# Patient Record
Sex: Female | Born: 1990 | Race: Black or African American | Hispanic: No | Marital: Single | Smoking: Never smoker
Health system: Southern US, Community
[De-identification: ages and names within clinical notes are randomized; demographics above are authoritative.]

## PROBLEM LIST (undated history)

## (undated) DIAGNOSIS — R946 Abnormal results of thyroid function studies: Secondary | ICD-10-CM

## (undated) DIAGNOSIS — T7840XA Allergy, unspecified, initial encounter: Secondary | ICD-10-CM

## (undated) DIAGNOSIS — D649 Anemia, unspecified: Secondary | ICD-10-CM

## (undated) HISTORY — PX: COSMETIC SURGERY: SHX468

## (undated) HISTORY — DX: Allergy, unspecified, initial encounter: T78.40XA

## (undated) HISTORY — PX: WISDOM TOOTH EXTRACTION: SHX21

## (undated) HISTORY — DX: Abnormal results of thyroid function studies: R94.6

## (undated) HISTORY — PX: BREAST SURGERY: SHX581

## (undated) HISTORY — DX: Anemia, unspecified: D64.9

---
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
## 2014-12-02 HISTORY — PX: BREAST REDUCTION SURGERY: SHX8

## 2019-05-07 ENCOUNTER — Telehealth: Payer: Self-pay

## 2019-05-07 NOTE — Telephone Encounter (Signed)
Coronavirus (COVID-19) Are you at risk?  Are you at risk for the Coronavirus (COVID-19)?  To be considered HIGH RISK for Coronavirus (COVID-19), you have to meet the following criteria:  . Traveled to [place], [place], [place], [place] or [place]; or in the [place] to [place], [place], [place], or [place]; and have fever, cough, and shortness of breath within the last 2 weeks of travel OR . Been in close contact with a person diagnosed with COVID-19 within the last 2 weeks and have fever, cough, and shortness of breath . IF YOU DO NOT MEET THESE CRITERIA, YOU ARE CONSIDERED LOW RISK FOR COVID-19.  What to do if you are HIGH RISK for COVID-19?  Marland Kitchen If you are having a medical emergency, call 911. . Seek medical care right away. Before you go to a doctor's office, urgent care or emergency department, call ahead and tell them about your recent travel, contact with someone diagnosed with COVID-19, and your symptoms. You should receive instructions from your physician's office regarding next steps of care.  . When you arrive at healthcare provider, tell the healthcare staff immediately you have returned from visiting [place], [place], [place], [place] or [place]; or traveled in the [place] to [place], [place], [place], or [place]; in the last two weeks or you have been in close contact with a person diagnosed with COVID-19 in the last 2 weeks.   . Tell the health care staff about your symptoms: fever, cough and shortness of breath. . After you have been seen by a medical provider, you will be either: o Tested for (COVID-19) and discharged home on quarantine except to seek medical care if symptoms worsen, and asked to  - Stay home and avoid contact with others until you get your results (4-5 days)  - Avoid travel on public transportation if possible (such as bus, train, or airplane) or o Sent to the Emergency Department by EMS for evaluation, COVID-19 testing, and possible  admission depending on your condition and test results.  What to do if you are LOW RISK for COVID-19?  Reduce your risk of any infection by using the same precautions used for avoiding the common cold or flu:  Marland Kitchen Wash your hands often with soap and warm water for at least 20 seconds.  If soap and water are not readily available, use an alcohol-based hand sanitizer with at least 60% alcohol.  . If coughing or sneezing, cover your mouth and nose by coughing or sneezing into the elbow areas of your shirt or coat, into a tissue or into your sleeve (not your hands). . Avoid shaking hands with others and consider head nods or verbal greetings only. . Avoid touching your eyes, nose, or mouth with unwashed hands.  . Avoid close contact with people who are Margaret Harmon. . Avoid places or events with large numbers of people in one location, like concerts or sporting events. . Carefully consider travel plans you have or are making. . If you are planning any travel outside or inside the [place], visit the CDC's Travelers' Health webpage for the latest health notices. . If you have some symptoms but not all symptoms, continue to monitor at home and seek medical attention if your symptoms worsen. . If you are having a medical emergency, call 911.  05/07/19 SCREENING NEG SLS ADDITIONAL HEALTHCARE OPTIONS FOR PATIENTS  [facility] / e-Visit: [email or web address]         [facility]: [phone]  [facility]: [phone]                   [facility]: [phone]  

## 2019-05-10 ENCOUNTER — Encounter: Payer: Self-pay | Admitting: Certified Nurse Midwife

## 2019-05-21 ENCOUNTER — Other Ambulatory Visit (HOSPITAL_COMMUNITY)
Admission: RE | Admit: 2019-05-21 | Discharge: 2019-05-21 | Disposition: A | Discharge status: Home / Self Care | Payer: 59 | Source: Ambulatory Visit | Attending: Certified Nurse Midwife | Admitting: Certified Nurse Midwife

## 2019-05-21 ENCOUNTER — Other Ambulatory Visit: Payer: Self-pay

## 2019-05-21 ENCOUNTER — Encounter: Payer: Self-pay | Admitting: Certified Nurse Midwife

## 2019-05-21 ENCOUNTER — Ambulatory Visit (INDEPENDENT_AMBULATORY_CARE_PROVIDER_SITE_OTHER): Payer: 59 | Admitting: Certified Nurse Midwife

## 2019-05-21 VITALS — BP 123/82 | HR 74 | Ht 65.0 in | Wt 282.8 lb

## 2019-05-21 DIAGNOSIS — R102 Pelvic and perineal pain: Secondary | ICD-10-CM

## 2019-05-21 DIAGNOSIS — Z124 Encounter for screening for malignant neoplasm of cervix: Secondary | ICD-10-CM | POA: Diagnosis not present

## 2019-05-21 DIAGNOSIS — N926 Irregular menstruation, unspecified: Secondary | ICD-10-CM | POA: Insufficient documentation

## 2019-05-21 DIAGNOSIS — M545 Low back pain, unspecified: Secondary | ICD-10-CM

## 2019-05-21 DIAGNOSIS — Z01419 Encounter for gynecological examination (general) (routine) without abnormal findings: Secondary | ICD-10-CM

## 2019-05-21 NOTE — Progress Notes (Signed)
ANNUAL PREVENTATIVE CARE GYN  ENCOUNTER NOTE  Subjective:       Margaret Harmon is a 28 y.o. G0P0000 female here for a routine annual gynecologic exam.  Current complaints: 1. Irregular menses 2. Pelvic/back pain  Reports irregular menses for the last years. No cycle in May. Notes intermittent pelvic and back pain.   Denies difficulty breathing or respiratory distress, chest pain, abdominal pain, excessive vaginal bleeding, dysuria, and leg pain or swelling.    Gynecologic History  Patient's last menstrual period was 05/15/2019 (exact date). Period Duration (Days): 5 Period Pattern: (!) Irregular Menstrual Flow: Heavy Menstrual Control: Maxi pad Dysmenorrhea: (!) Moderate Dysmenorrhea Symptoms: Other (Comment)(back pain)  Contraception: abstinence  Last Pap: 2016. Results were: normal  Obstetric History  OB History  Gravida Para Term Preterm AB Living  0 0 0 0 0 0  SAB TAB Ectopic Multiple Live Births  0 0 0 0 0    Current Outpatient Medications on File Prior to Visit  Medication Sig Dispense Refill  . cyclobenzaprine (FLEXERIL) 5 MG tablet Take 5 mg by mouth as needed.    Marland Kitchen ibuprofen (ADVIL) 800 MG tablet Take 800 mg by mouth as needed.     No current facility-administered medications on file prior to visit.     No Known Allergies  Social History   Socioeconomic History  . Marital status: Single    Spouse name: Not on file  . Number of children: Not on file  . Years of education: Not on file  . Highest education level: Not on file  Occupational History  . Not on file  Social Needs  . Financial resource strain: Not on file  . Food insecurity    Worry: Not on file    Inability: Not on file  . Transportation needs    Medical: Not on file    Non-medical: Not on file  Tobacco Use  . Smoking status: Never Smoker  . Smokeless tobacco: Never Used  Substance and Sexual Activity  . Alcohol use: Yes    Comment: occasional   . Drug use: Never  . Sexual  activity: Not Currently    Birth control/protection: None  Lifestyle  . Physical activity    Days per week: Not on file    Minutes per session: Not on file  . Stress: Not on file  Relationships  . Social Herbalist on phone: Not on file    Gets together: Not on file    Attends religious service: Not on file    Active member of club or organization: Not on file    Attends meetings of clubs or organizations: Not on file    Relationship status: Not on file  . Intimate partner violence    Fear of current or ex partner: Not on file    Emotionally abused: Not on file    Physically abused: Not on file    Forced sexual activity: Not on file  Other Topics Concern  . Not on file  Social History Narrative  . Not on file    Family History  Problem Relation Age of Onset  . Diabetes Father   . Breast cancer Neg Hx   . Ovarian cancer Neg Hx   . Colon cancer Neg Hx     The following portions of the patient's history were reviewed and updated as appropriate: allergies, current medications, past family history, past medical history, past social history, past surgical history and problem list.  Review  of Systems  ROS negative except as noted above. Information obtained from patient.    Objective:   BP 123/82   Pulse 74   Ht 5\' 5"  (1.651 m)   Wt 282 lb 12.8 oz (128.3 kg)   LMP 05/15/2019 (Exact Date)   BMI 47.06 kg/m    CONSTITUTIONAL: Well-developed, well-nourished female in no acute distress.   PSYCHIATRIC: Normal Gitto and affect. Normal behavior. Normal judgment and thought content.  [place]: Alert and oriented to person, place, and time. Normal muscle tone coordination. No cranial nerve deficit noted.  HENT:  Normocephalic, atraumatic, External right and left ear normal.  EYES: Conjunctivae and EOM are normal. Pupils are equal and round.   NECK: Normal range of motion, supple, no masses.  Normal thyroid.   SKIN: Skin is warm and dry. No rash noted. Not  diaphoretic. No erythema. No pallor. Professional tattoos present.   CARDIOVASCULAR: Normal heart rate noted, regular rhythm, no murmur.  RESPIRATORY: Clear to auscultation bilaterally. Effort and breath sounds normal, no problems with respiration noted.  BREASTS: Patient declined exam.   ABDOMEN: Soft, normal bowel sounds, no distention noted.  No tenderness, rebound or guarding.   PELVIC:  External Genitalia: Normal  BUS: Normal  Vagina: Normal  Cervix: Normal, Pap collected  Uterus: Normal  Adnexa: Normal  MUSCULOSKELETAL: Normal range of motion. No tenderness.  No cyanosis, clubbing, or edema.  2+ distal pulses.  LYMPHATIC: No Axillary, Supraclavicular, or Inguinal Adenopathy.  Assessment:   Annual gynecologic examination 28 y.o.   Contraception: abstinence   Obesity 3   Problem List Items Addressed This Visit      Other   Obesity, morbid, BMI 40.0-49.9 ([place])   Irregular menses    Other Visit Diagnoses    Well woman exam    -  Primary   Screening for cervical cancer          Plan:   Pap: Pap, Reflex if ASCUS, will contact patient with results  Labs: See orders, will contact patient with results  Routine preventative health maintenance measures emphasized: Exercise/Diet/Weight control, Tobacco Warnings, Alcohol/Substance use risks, Stress Management, Peer Pressure Issues and Safe Sex; see AVS  Reviewed red flag symptoms and when to call  RTC x 1-2 weeks for ultrasound and results review or sooner if needed   Diona Fanti, CNM [facility], [facility] 05/21/19 9:38 AM

## 2019-05-21 NOTE — Addendum Note (Signed)
Addended by: Edrick Oh J on: 05/21/2019 04:17 PM   Modules accepted: Orders

## 2019-05-21 NOTE — Progress Notes (Signed)
Patient here for annual exam, c/o irregular menstrual periods x1 year.

## 2019-05-21 NOTE — Patient Instructions (Signed)
Abnormal Uterine Bleeding Abnormal uterine bleeding means bleeding more than usual from your uterus. It can include:  Bleeding between periods.  Bleeding after sex.  Bleeding that is heavier than normal.  Periods that last longer than usual.  Bleeding after you have stopped having your period (menopause). There are many problems that may cause this. You should see a doctor for any kind of bleeding that is not normal. Treatment depends on the cause of the bleeding. Follow these instructions at home:  Watch your condition for any changes.  Do not use tampons, douche, or have sex, if your doctor tells you not to.  Change your pads often.  Get regular well-woman exams. Make sure they include a pelvic exam and cervical cancer screening.  Keep all follow-up visits as told by your doctor. This is important. Contact a doctor if:  The bleeding lasts more than one week.  You feel dizzy at times.  You feel like you are going to throw up (nauseous).  You throw up. Get help right away if:  You pass out.  You have to change pads every hour.  You have belly (abdominal) pain.  You have a fever.  You get sweaty.  You get weak.  You passing large blood clots from your vagina. Summary  Abnormal uterine bleeding means bleeding more than usual from your uterus.  There are many problems that may cause this. You should see a doctor for any kind of bleeding that is not normal.  Treatment depends on the cause of the bleeding. This information is not intended to replace advice given to you by your health care provider. Make sure you discuss any questions you have with your health care provider. Document Released: 09/15/2009 Document Revised: 11/12/2016 Document Reviewed: 11/12/2016 Elsevier Interactive Patient Education  2019 Elsevier Inc. Pelvic Pain, Female Pelvic pain is pain in your lower belly (abdomen), below your belly button and between your hips. The pain may start suddenly  (be acute), keep coming back (be recurring), or last a long time (become chronic). Pelvic pain that lasts longer than 6 months is called chronic pelvic pain. There are many causes of pelvic pain. Sometimes the cause of pelvic pain is not known. Follow these instructions at home:   Take over-the-counter and prescription medicines only as told by your doctor.  Rest as told by your doctor.  Do not have sex if it hurts.  Keep a journal of your pelvic pain. Write down: ? When the pain started. ? Where the pain is located. ? What seems to make the pain better or worse, such as food or your period (menstrual cycle). ? Any symptoms you have along with the pain.  Keep all follow-up visits as told by your doctor. This is important. Contact a doctor if:  Medicine does not help your pain.  Your pain comes back.  You have new symptoms.  You have unusual discharge or bleeding from your vagina.  You have a fever or chills.  You are having trouble pooping (constipation).  You have blood in your pee (urine) or poop (stool).  Your pee smells bad.  You feel weak or light-headed. Get help right away if:  You have sudden pain that is very bad.  Your pain keeps getting worse.  You have very bad pain and also have any of these symptoms: ? A fever. ? Feeling sick to your stomach (nausea). ? Throwing up (vomiting). ? Being very sweaty.  You pass out (lose consciousness). Summary  Pelvic   pain is pain in your lower belly (abdomen), below your belly button and between your hips.  There are many possible causes of pelvic pain.  Keep a journal of your pelvic pain. This information is not intended to replace advice given to you by your health care provider. Make sure you discuss any questions you have with your health care provider. Document Released: 05/06/2008 Document Revised: 05/06/2018 Document Reviewed: 05/06/2018 Elsevier Interactive Patient Education  2019 [place] 18-39 Years, Female Preventive care refers to lifestyle choices and visits with your health care provider that can promote health and wellness. What does preventive care include?   A yearly physical exam. This is also called an annual well check.  Dental exams once or twice a year.  Routine eye exams. Ask your health care provider how often you should have your eyes checked.  Personal lifestyle choices, including: ? Daily care of your teeth and gums. ? Regular physical activity. ? Eating a healthy diet. ? Avoiding tobacco and drug use. ? Limiting alcohol use. ? Practicing safe sex. ? Taking vitamin and mineral supplements as recommended by your health care provider. What happens during an annual well check? The services and screenings done by your health care provider during your annual well check will depend on your age, overall health, lifestyle risk factors, and family history of disease. Counseling Your health care provider may ask you questions about your:  Alcohol use.  Tobacco use.  Drug use.  Emotional well-being.  Home and relationship well-being.  Sexual activity.  Eating habits.  Work and work Statistician.  Method of birth control.  Menstrual cycle.  Pregnancy history. Screening You may have the following tests or measurements:  Height, weight, and BMI.  Diabetes screening. This is done by checking your blood sugar (glucose) after you have not eaten for a while (fasting).  Blood pressure.  Lipid and cholesterol levels. These may be checked every 5 years starting at age 20.  Skin check.  Hepatitis C blood test.  Hepatitis B blood test.  Sexually transmitted disease (STD) testing.  BRCA-related cancer screening. This may be done if you have a family history of breast, ovarian, tubal, or peritoneal cancers.  Pelvic exam and Pap test. This may be done every 3 years starting at age 66. Starting at age 30, this may be done every  5 years if you have a Pap test in combination with an HPV test. Discuss your test results, treatment options, and if necessary, the need for more tests with your health care provider. Vaccines Your health care provider may recommend certain vaccines, such as:  Influenza vaccine. This is recommended every year.  Tetanus, diphtheria, and acellular pertussis (Tdap, Td) vaccine. You may need a Td booster every 10 years.  Varicella vaccine. You may need this if you have not been vaccinated.  HPV vaccine. If you are 40 or younger, you may need three doses over 6 months.  Measles, mumps, and rubella (MMR) vaccine. You may need at least one dose of MMR. You may also need a second dose.  Pneumococcal 13-valent conjugate (PCV13) vaccine. You may need this if you have certain conditions and were not previously vaccinated.  Pneumococcal polysaccharide (PPSV23) vaccine. You may need one or two doses if you smoke cigarettes or if you have certain conditions.  Meningococcal vaccine. One dose is recommended if you are age 36-21 years and a first-year college student living in a residence hall, or if you have one of several  medical conditions. You may also need additional booster doses.  Hepatitis A vaccine. You may need this if you have certain conditions or if you travel or work in places where you may be exposed to hepatitis A.  Hepatitis B vaccine. You may need this if you have certain conditions or if you travel or work in places where you may be exposed to hepatitis B.  Haemophilus influenzae type b (Hib) vaccine. You may need this if you have certain risk factors. Talk to your health care provider about which screenings and vaccines you need and how often you need them. This information is not intended to replace advice given to you by your health care provider. Make sure you discuss any questions you have with your health care provider. Document Released: 01/14/2002 Document Revised: 07/01/2017  Document Reviewed: 09/19/2015 Elsevier Interactive Patient Education  2019 Elsevier Inc.  

## 2019-05-22 LAB — FSH/LH
FSH: 3 m[IU]/mL
LH: 15.1 m[IU]/mL

## 2019-05-22 LAB — CBC
Hematocrit: 39.6 % (ref 34.0–46.6)
Hemoglobin: 13 g/dL (ref 11.1–15.9)
MCH: 26.9 pg (ref 26.6–33.0)
MCHC: 32.8 g/dL (ref 31.5–35.7)
MCV: 82 fL (ref 79–97)
Platelets: 340 10*3/uL (ref 150–450)
RBC: 4.84 x10E6/uL (ref 3.77–5.28)
RDW: 14.1 % (ref 11.7–15.4)
WBC: 4.5 10*3/uL (ref 3.4–10.8)

## 2019-05-22 LAB — THYROID PANEL WITH TSH
Free Thyroxine Index: 7.6 — ABNORMAL HIGH (ref 1.2–4.9)
T3 Uptake Ratio: 50 % — ABNORMAL HIGH (ref 24–39)
T4, Total: 15.1 ug/dL — ABNORMAL HIGH (ref 4.5–12.0)
TSH: 0.352 u[IU]/mL — ABNORMAL LOW (ref 0.450–4.500)

## 2019-05-22 LAB — ESTRADIOL: Estradiol: 159.6 pg/mL

## 2019-05-24 ENCOUNTER — Other Ambulatory Visit: Payer: Self-pay | Admitting: Certified Nurse Midwife

## 2019-05-24 DIAGNOSIS — R7989 Other specified abnormal findings of blood chemistry: Secondary | ICD-10-CM

## 2019-05-25 ENCOUNTER — Other Ambulatory Visit: Payer: Self-pay

## 2019-05-25 ENCOUNTER — Encounter: Payer: Self-pay | Admitting: Certified Nurse Midwife

## 2019-05-25 LAB — CERVICOVAGINAL ANCILLARY ONLY
Bacterial vaginitis: POSITIVE — AB
Candida vaginitis: NEGATIVE
Trichomonas: NEGATIVE

## 2019-05-25 LAB — CYTOLOGY - PAP: Diagnosis: NEGATIVE

## 2019-05-26 ENCOUNTER — Other Ambulatory Visit: Payer: Self-pay

## 2019-05-26 ENCOUNTER — Encounter: Payer: Self-pay | Admitting: Certified Nurse Midwife

## 2019-05-26 MED ORDER — METRONIDAZOLE 500 MG PO TABS: 500.0000 mg | ORAL_TABLET | Freq: Two times a day (BID) | ORAL | 0 refills | Status: DC

## 2019-05-26 NOTE — Telephone Encounter (Signed)
Flagyl sent to pharmacy on file per ML. Mychart message sent to pt.

## 2019-06-02 ENCOUNTER — Telehealth: Payer: Self-pay

## 2019-06-02 NOTE — Telephone Encounter (Signed)
Coronavirus (COVID-19) Are you at risk?  Are you at risk for the Coronavirus (COVID-19)?  To be considered HIGH RISK for Coronavirus (COVID-19), you have to meet the following criteria:  . Traveled to [place], [place], [place], [place] or [place]; or in the [place] to [place], [place], [place], or [place]; and have fever, cough, and shortness of breath within the last 2 weeks of travel OR . Been in close contact with a person diagnosed with COVID-19 within the last 2 weeks and have fever, cough, and shortness of breath . IF YOU DO NOT MEET THESE CRITERIA, YOU ARE CONSIDERED LOW RISK FOR COVID-19.  What to do if you are HIGH RISK for COVID-19?  Marland Kitchen If you are having a medical emergency, call 911. . Seek medical care right away. Before you go to a doctor's office, urgent care or emergency department, call ahead and tell them about your recent travel, contact with someone diagnosed with COVID-19, and your symptoms. You should receive instructions from your physician's office regarding next steps of care.  . When you arrive at healthcare provider, tell the healthcare staff immediately you have returned from visiting [place], [place], [place], [place] or [place]; or traveled in the [place] to [place], [place], [place], or [place]; in the last two weeks or you have been in close contact with a person diagnosed with COVID-19 in the last 2 weeks.   . Tell the health care staff about your symptoms: fever, cough and shortness of breath. . After you have been seen by a medical provider, you will be either: o Tested for (COVID-19) and discharged home on quarantine except to seek medical care if symptoms worsen, and asked to  - Stay home and avoid contact with others until you get your results (4-5 days)  - Avoid travel on public transportation if possible (such as bus, train, or airplane) or o Sent to the Emergency Department by EMS for evaluation, COVID-19 testing, and possible  admission depending on your condition and test results.  What to do if you are LOW RISK for COVID-19?  Reduce your risk of any infection by using the same precautions used for avoiding the common cold or flu:  Marland Kitchen Wash your hands often with soap and warm water for at least 20 seconds.  If soap and water are not readily available, use an alcohol-based hand sanitizer with at least 60% alcohol.  . If coughing or sneezing, cover your mouth and nose by coughing or sneezing into the elbow areas of your shirt or coat, into a tissue or into your sleeve (not your hands). . Avoid shaking hands with others and consider head nods or verbal greetings only. . Avoid touching your eyes, nose, or mouth with unwashed hands.  . Avoid close contact with people who are Margaret Harmon. . Avoid places or events with large numbers of people in one location, like concerts or sporting events. . Carefully consider travel plans you have or are making. . If you are planning any travel outside or inside the [place], visit the CDC's Travelers' Health webpage for the latest health notices. . If you have some symptoms but not all symptoms, continue to monitor at home and seek medical attention if your symptoms worsen. . If you are having a medical emergency, call 911.  06/02/19 SCREENING NEG SLS ADDITIONAL HEALTHCARE OPTIONS FOR PATIENTS  [facility] / e-Visit: [email or web address]         [facility]: [phone]  [facility]: [phone]                   [facility]: [phone]  

## 2019-06-03 ENCOUNTER — Ambulatory Visit (INDEPENDENT_AMBULATORY_CARE_PROVIDER_SITE_OTHER): Payer: 59

## 2019-06-03 ENCOUNTER — Ambulatory Visit (INDEPENDENT_AMBULATORY_CARE_PROVIDER_SITE_OTHER): Payer: 59 | Admitting: Certified Nurse Midwife

## 2019-06-03 ENCOUNTER — Other Ambulatory Visit: Payer: Self-pay

## 2019-06-03 ENCOUNTER — Encounter: Payer: Self-pay | Admitting: Certified Nurse Midwife

## 2019-06-03 VITALS — BP 120/81 | HR 73 | Ht 65.0 in | Wt 287.3 lb

## 2019-06-03 DIAGNOSIS — M545 Low back pain, unspecified: Secondary | ICD-10-CM

## 2019-06-03 DIAGNOSIS — N926 Irregular menstruation, unspecified: Secondary | ICD-10-CM

## 2019-06-03 DIAGNOSIS — D250 Submucous leiomyoma of uterus: Secondary | ICD-10-CM | POA: Diagnosis not present

## 2019-06-03 DIAGNOSIS — R102 Pelvic and perineal pain: Secondary | ICD-10-CM

## 2019-06-03 DIAGNOSIS — Z09 Encounter for follow-up examination after completed treatment for conditions other than malignant neoplasm: Secondary | ICD-10-CM | POA: Diagnosis not present

## 2019-06-03 DIAGNOSIS — Z01419 Encounter for gynecological examination (general) (routine) without abnormal findings: Secondary | ICD-10-CM | POA: Diagnosis not present

## 2019-06-03 DIAGNOSIS — N939 Abnormal uterine and vaginal bleeding, unspecified: Secondary | ICD-10-CM | POA: Diagnosis not present

## 2019-06-03 NOTE — Progress Notes (Signed)
GYN ENCOUNTER NOTE  Subjective:       Margaret Harmon is a 28 y.o. G0P0000 female here for results review.   Originally seen for [employer] on 05/21/2019 with complaints of irregular menses. Work up included labs and ultrasound.   Denies difficulty breathing or respiratory distress, chest pain, abdominal pain, excessive vaginal bleeding, dysuria, and leg pain or swelling.    Gynecologic History  Patient's last menstrual period was 05/08/2019. Period Cycle (Days): 28 Period Duration (Days): 5 Period Pattern: Regular Menstrual Flow: Heavy, Light Menstrual Control: Maxi pad Dysmenorrhea: (!) Mild Dysmenorrhea Symptoms: Cramping  Contraception: abstinence  Last Pap: 05/2019. Results were: normal  Obstetric History  OB History  Gravida Para Term Preterm AB Living  0 0 0 0 0 0  SAB TAB Ectopic Multiple Live Births  0 0 0 0 0    No past medical history on file.   Current Outpatient Medications on File Prior to Visit  Medication Sig Dispense Refill  . cyclobenzaprine (FLEXERIL) 5 MG tablet Take 5 mg by mouth as needed.    Marland Kitchen ibuprofen (ADVIL) 800 MG tablet Take 800 mg by mouth as needed.     No current facility-administered medications on file prior to visit.     No Known Allergies  Social History   Socioeconomic History  . Marital status: Single    Spouse name: Not on file  . Number of children: Not on file  . Years of education: Not on file  . Highest education level: Not on file  Occupational History  . Not on file  Social Needs  . Financial resource strain: Not on file  . Food insecurity    Worry: Not on file    Inability: Not on file  . Transportation needs    Medical: Not on file    Non-medical: Not on file  Tobacco Use  . Smoking status: Never Smoker  . Smokeless tobacco: Never Used  Substance and Sexual Activity  . Alcohol use: Yes    Comment: occasional   . Drug use: Never  . Sexual activity: Not Currently    Birth control/protection: None   Lifestyle  . Physical activity    Days per week: Not on file    Minutes per session: Not on file  . Stress: Not on file  Relationships  . Social Herbalist on phone: Not on file    Gets together: Not on file    Attends religious service: Not on file    Active member of club or organization: Not on file    Attends meetings of clubs or organizations: Not on file    Relationship status: Not on file  . Intimate partner violence    Fear of current or ex partner: Not on file    Emotionally abused: Not on file    Physically abused: Not on file    Forced sexual activity: Not on file  Other Topics Concern  . Not on file  Social History Narrative  . Not on file    Family History  Problem Relation Age of Onset  . Diabetes Father   . Breast cancer Other   . Ovarian cancer Neg Hx   . Colon cancer Neg Hx     The following portions of the patient's history were reviewed and updated as appropriate: allergies, current medications, past family history, past medical history, past social history, past surgical history and problem list.  Review of Systems  ROS negative except as  noted above. Information obtained from patient.  Objective:   BP 120/81   Pulse 73   Ht 5\' 5"  (1.651 m)   Wt 287 lb 4.8 oz (130.3 kg)   LMP 05/08/2019   BMI 47.81 kg/m    CONSTITUTIONAL: Well-developed, well-nourished female in no acute distress.   PHYSICAL EXAM: Not indicated.   ULTRASOUND REPORT  Location: [facility]  Date of Service: 06/03/2019     Indications:Irregular bleeding. Findings:  The uterus is anteverted and measures 8.1 x 4.0 x 4.7 cm. Echo texture is homogenous without evidence of focal masses.  The Endometrium measures 5 mm.Hyperechoic area with in the endometrium measuring 1.2 x 1.0 x 1.1 cm  Right Ovary measures 3.1 x 1.0 x 2.1  cm. It is normal in appearance. Left Ovary measures 2.8 x 1.2 x 2.4 cm. It is normal in appearance. Survey of the adnexa demonstrates  no adnexal masses. There is no free fluid in the cul de sac.  Impression: 1. Submucosal fibroid vs polyp with in the endometrium as described above.  Recommendations: 1.Clinical correlation with the patient's History and Physical Exam.   Recent Results (from the past 2160 hour(s))  Cytology - PAP     Status: None   Collection Time: 05/21/19 12:00 AM  Result Value Ref Range   Adequacy      Satisfactory for evaluation  endocervical/transformation zone component PRESENT.   Diagnosis      NEGATIVE FOR INTRAEPITHELIAL LESIONS OR MALIGNANCY.   Material Submitted CervicoVaginal Pap [ThinPrep Imaged]    CYTOLOGY - PAP PAP RESULT   Cervicovaginal ancillary only     Status: Abnormal   Collection Time: 05/21/19 12:00 AM  Result Value Ref Range   Bacterial vaginitis **POSITIVE for Gardnerella vaginalis** (A)     Comment: Normal Reference Range - Negative   Candida vaginitis Negative for Candida species     Comment: Normal Reference Range - Negative   Trichomonas Negative     Comment: Normal Reference Range - Negative  CBC     Status: None   Collection Time: 05/21/19 10:06 AM  Result Value Ref Range   WBC 4.5 3.4 - 10.8 x10E3/uL   RBC 4.84 3.77 - 5.28 x10E6/uL   Hemoglobin 13.0 11.1 - 15.9 g/dL   Hematocrit 39.6 34.0 - 46.6 %   MCV 82 79 - 97 fL   MCH 26.9 26.6 - 33.0 pg   MCHC 32.8 31.5 - 35.7 g/dL   RDW 14.1 11.7 - 15.4 %   Platelets 340 150 - 450 x10E3/uL  Thyroid Panel With TSH     Status: Abnormal   Collection Time: 05/21/19 10:06 AM  Result Value Ref Range   TSH 0.352 (L) 0.450 - 4.500 uIU/mL   T4, Total 15.1 (H) 4.5 - 12.0 ug/dL   T3 Uptake Ratio 50 (H) 24 - 39 %   Free Thyroxine Index 7.6 (H) 1.2 - 4.9  FSH/LH     Status: None   Collection Time: 05/21/19 10:06 AM  Result Value Ref Range   LH 15.1 mIU/mL    Comment:                     Adult Female:                       Follicular phase      2.4 -  12.6  Ovulation phase      14.0 -  95.6                        Luteal phase          1.0 -  11.4                       Postmenopausal        7.7 -  58.5    FSH 3.0 mIU/mL    Comment:                     Adult Female:                       Follicular phase      3.5 -  12.5                       Ovulation phase       4.7 -  21.5                       Luteal phase          1.7 -   7.7                       Postmenopausal       25.8 - 134.8   Estradiol     Status: None   Collection Time: 05/21/19 10:06 AM  Result Value Ref Range   Estradiol 159.6 pg/mL    Comment:                     Adult Female:                       Follicular phase   43.3 -   166.0                       Ovulation phase    85.8 -   498.0                       Luteal phase       43.8 -   211.0                       Postmenopausal     <6.0 -    54.7                     Pregnancy                       1st trimester     215.0 - >4300.0                     Girls (1-10 years)    6.0 -    27.0 Roche ECLIA methodology     Assessment:   1. Abnormal uterine bleeding   2. Follow up    Plan:   Ultrasound findings and lab results reviewed with patient, verbalized understanding.   Referral to Endocrinology, see orders.  Education regarding uterine fibroid and treatment options; handouts provided and see AVS. Sample of Slynd given.   Reviewed red flag symptoms and when to call.   RTC x 1 year for ANNUAL EXAM or sooner if  needed.    Diona Fanti, CNM [facility], [facility] 06/03/19 12:20 PM

## 2019-06-03 NOTE — Patient Instructions (Addendum)
Drospirenone tablets (contraception) What is this medicine? DROSPIRENONE (dro SPY re nown) is an oral contraceptive (birth control pill). The product contains a female hormone known as a progestin. It is used to prevent pregnancy. This medicine may be used for other purposes; ask your health care provider or pharmacist if you have questions. COMMON BRAND NAME(S): FeRiva 21/7, SLYND What should I tell my health care provider before I take this medicine? They need to know if you have any of these conditions:  abnormal vaginal bleeding  adrenal gland disease  blood vessel disease or blood clots  breast, cervical, endometrial, ovarian, liver, or uterine cancer  diabetes  heart disease or recent heart attack  high potassium level  kidney disease  liver disease  mental depression  migraine headaches  stroke  an unusual or allergic reaction to drospirenone, progestins, or other medicines, foods, dyes, or preservatives  pregnant or trying to get pregnant  breast-feeding How should I use this medicine? Take this medicine by mouth. To reduce nausea, this medicine may be taken with food. Follow the directions on the prescription label. Take this medicine at the same time each day and in the order directed on the package. Do not take your medicine more often than directed. A patient package insert for the product will be given with each prescription and refill. Read this sheet carefully each time. The sheet may change frequently. Talk to your pediatrician regarding the use of this medicine in children. Special care may be needed. This medicine has been used in female children who have started having menstrual periods. Overdosage: If you think you have taken too much of this medicine contact a poison control center or emergency room at once. NOTE: This medicine is only for you. Do not share this medicine with others. What if I miss a dose? If you miss a dose, take it as soon as you  can and refer to the patient information sheet you received with your medicine for direction. If you miss more than one pill, this medicine may not be as effective and you may need to use another form of birth control. What may interact with this medicine? Do not take this medicine with any of the following medications:  atazanavir; cobicistat  bosentan  fosamprenavir This medicine may also interact with the following medications:  aprepitant  barbiturates like phenobarbital, primidone  carbamazepine  certain antibiotics like clarithromycin, rifampin, rifabutin, rifapentine  certain antivirals for HIV or hepatitis  certain diuretics like amiloride, spironolactone, triamterene  certain medicines for fungal infections like griseofulvin, ketoconazole, itraconazole, voriconazole  certain medicines for blood pressure, heart disease  cyclosporine  felbamate  heparin  medicines for diabetes  modafinil  NSAIDs, medicines for pain and inflammation, like ibuprofen or naproxen  oxcarbazepine  phenytoin  potassium supplements  rufinamide  St. John's wort  topiramate This list may not describe all possible interactions. Give your health care provider a list of all the medicines, herbs, non-prescription drugs, or dietary supplements you use. Also tell them if you smoke, drink alcohol, or use illegal drugs. Some items may interact with your medicine. What should I watch for while using this medicine? Visit your doctor or health care professional for regular checks on your progress. You will need a regular breast and pelvic exam and Pap smear while on this medicine. You may need blood work done while you are taking this medicine. If you have any reason to think you are pregnant, stop taking this medicine right away and contact  your doctor or health care professional. This medicine does not protect you against HIV infection (AIDS) or any other sexually transmitted diseases. If  you are going to have elective surgery, you may need to stop taking this medicine before the surgery. Consult your health care professional for advice. What side effects may I notice from receiving this medicine? Side effects that you should report to your doctor or health care professional as soon as possible:  allergic reactions like skin rash, itching or hives, swelling of the face, lips, or tongue  breast tissue changes or discharge  depressed Kleen  severe pain, swelling, or tenderness in the abdomen  signs and symptoms of a blood clot such as chest pain; shortness of breath; pain, swelling, or warmth in the leg  signs and symptoms of increased potassium like muscle weakness; chest pain; or fast, irregular heartbeat  signs and symptoms of liver injury like dark yellow or brown urine; general ill feeling or flu-like symptoms; light-colored stools; loss of appetite; nausea; right upper belly pain; unusually weak or tired; yellowing of the eyes or skin  signs and symptoms of a stroke like changes in vision; confusion; trouble speaking or understanding; severe headaches; sudden numbness or weakness of the face, arm or leg; trouble walking; dizziness; loss of balance or coordination  unusual vaginal bleeding  unusually weak or tired Side effects that usually do not require medical attention (report these to your doctor or health care professional if they continue or are bothersome):  acne  breast tenderness  headache  menstrual cramps  nausea  weight gain This list may not describe all possible side effects. Call your doctor for medical advice about side effects. You may report side effects to FDA at 1-800-FDA-1088. Where should I keep my medicine? Keep out of the reach of children. Store at room temperature between 20 and 25 degrees C (68 and 77 degrees F). Throw away any unused medicine after the expiration date. NOTE: This sheet is a summary. It may not cover all possible  information. If you have questions about this medicine, talk to your doctor, pharmacist, or health care provider.  2020 Elsevier/Gold Standard (2018-04-29 15:01:56) Uterine Fibroids  Uterine fibroids are lumps of tissue (tumors) in your womb (uterus). They are not cancer (are benign). Most women with this condition do not need treatment. Sometimes fibroids can affect your ability to have children (your fertility). If that happens, you may need surgery to take out the fibroids. Follow these instructions at home:  Take over-the-counter and prescription medicines only as told by your doctor. Your doctor may suggest NSAIDs (such as aspirin or ibuprofen) to help with pain.  Ask your doctor if you should: ? Take iron pills. ? Eat more foods that have iron in them, such as dark green, leafy vegetables.  If directed, apply heat to your back or belly to reduce pain. Use the heat source that your doctor recommends, such as a moist heat pack or a heating pad. ? Put a towel between your skin and the heat source. ? Leave the heat on for 20-30 minutes. ? Remove the heat if your skin turns bright red. This is especially important if you are unable to feel pain, heat, or cold. You may have a greater risk of getting burned.  Pay close attention to your period (menstrual) cycles. Tell your doctor about any changes, such as: ? A heavier blood flow than usual. ? Needing to use more pads or tampons than normal. ? A change  in how many days your period lasts. ? A change in symptoms that come with your period, such as cramps or back pain.  Keep all follow-up visits as told by your doctor. This is important. Your doctor may need to watch your fibroids over time for any changes. Contact a doctor if you:  Have pain that does not get better with medicine or heat, such as pain or cramps in: ? Your back. ? The area between your hip bones (pelvic area). ? Your belly.  Have new bleeding between your periods.   Have more bleeding during or between your periods.  Feel very tired or weak.  Feel light-headed. Get help right away if you:  Pass out (faint).  Have pain in the area between your hip bones that suddenly gets worse.  Have bleeding that soaks a tampon or pad in 30 minutes or less. Summary  Uterine fibroids are lumps of tissue (tumors) in your womb (uterus). They are not cancer.  The only treatment that most women need is taking aspirin or ibuprofen for pain.  Contact a doctor if you have pain or cramps that do not get better with medicine.  Make sure you know what symptoms you should get help for right away. This information is not intended to replace advice given to you by your health care provider. Make sure you discuss any questions you have with your health care provider. Document Released: 12/21/2010 Document Revised: 10/31/2017 Document Reviewed: 10/14/2017 Elsevier Patient Education  2020 [employer].   Hyperthyroidism  Hyperthyroidism is when the thyroid gland is too active (overactive). The thyroid gland is a small gland located in the lower front part of the neck, just in front of the windpipe (trachea). This gland makes hormones that help control how the body uses food for energy (metabolism) as well as how the heart and brain function. These hormones also play a role in keeping your bones strong. When the thyroid is overactive, it produces too much of a hormone called thyroxine. What are the causes? This condition may be caused by:  Graves' disease. This is a disorder in which the body's disease-fighting system (immune system) attacks the thyroid gland. This is the most common cause.  Inflammation of the thyroid gland.  A tumor in the thyroid gland.  Use of certain medicines, including: ? Prescription thyroid hormone replacement. ? Herbal supplements that mimic thyroid hormones. ? Amiodarone therapy.  Solid or fluid-filled lumps within your thyroid gland  (thyroid nodules).  Taking in a large amount of iodine from foods or medicines. What increases the risk? You are more likely to develop this condition if:  You are female.  You have a family history of thyroid conditions.  You smoke tobacco.  You use a medicine called lithium.  You take medicines that affect the immune system (immunosuppressants). What are the signs or symptoms? Symptoms of this condition include:  Nervousness.  Inability to tolerate heat.  Unexplained weight loss.  Diarrhea.  Change in the texture of hair or skin.  Heart skipping beats or making extra beats.  Rapid heart rate.  Loss of menstruation.  Shaky hands.  Fatigue.  Restlessness.  Sleep problems.  Enlarged thyroid gland or a lump in the thyroid (nodule). You may also have symptoms of Graves' disease, which may include:  Protruding eyes.  Dry eyes.  Red or swollen eyes.  Problems with vision. How is this diagnosed? This condition may be diagnosed based on:  Your symptoms and medical history.  A  physical exam.  Blood tests.  Thyroid ultrasound. This test involves using sound waves to produce images of the thyroid gland.  A thyroid scan. A radioactive substance is injected into a vein, and images show how much iodine is present in the thyroid.  Radioactive iodine uptake test (RAIU). A small amount of radioactive iodine is given by mouth to see how much iodine the thyroid absorbs after a certain amount of time. How is this treated? Treatment depends on the cause and severity of the condition. Treatment may include:  Medicines to reduce the amount of thyroid hormone your body makes.  Radioactive iodine treatment (radioiodine therapy). This involves swallowing a small dose of radioactive iodine, in capsule or liquid form, to kill thyroid cells.  Surgery to remove part or all of your thyroid gland. You may need to take thyroid hormone replacement medicine for the rest of your  life after thyroid surgery.  Medicines to help manage your symptoms. Follow these instructions at home:   Take over-the-counter and prescription medicines only as told by your health care provider.  Do not use any products that contain nicotine or tobacco, such as cigarettes and e-cigarettes. If you need help quitting, ask your health care provider.  Follow any instructions from your health care provider about diet. You may be instructed to limit foods that contain iodine.  Keep all follow-up visits as told by your health care provider. This is important. ? You will need to have blood tests regularly so that your health care provider can monitor your condition. Contact a health care provider if:  Your symptoms do not get better with treatment.  You have a fever.  You are taking thyroid hormone replacement medicine and you: ? Have symptoms of depression. ? Feel like you are tired all the time. ? Gain weight. Get help right away if:  You have chest pain.  You have decreased alertness or a change in your awareness.  You have abdominal pain.  You feel dizzy.  You have a rapid heartbeat.  You have an irregular heartbeat.  You have difficulty breathing. Summary  The thyroid gland is a small gland located in the lower front part of the neck, just in front of the windpipe (trachea).  Hyperthyroidism is when the thyroid gland is too active (overactive) and produces too much of a hormone called thyroxine.  The most common cause is Graves' disease, a disorder in which your immune system attacks the thyroid gland.  Hyperthyroidism can cause various symptoms, such as unexplained weight loss, nervousness, inability to tolerate heat, or changes in your heartbeat.  Treatment may include medicine to reduce the amount of thyroid hormone your body makes, radioiodine therapy, surgery, or medicines to manage symptoms. This information is not intended to replace advice given to you by  your health care provider. Make sure you discuss any questions you have with your health care provider. Document Released: 11/18/2005 Document Revised: 10/31/2017 Document Reviewed: 10/29/2017 Elsevier Patient Education  2020 [employer].

## 2019-06-03 NOTE — Progress Notes (Signed)
Patient here to discuss 6/19 labs and ultrasound that was done today.

## 2019-06-08 ENCOUNTER — Other Ambulatory Visit: Payer: Self-pay

## 2019-06-09 ENCOUNTER — Ambulatory Visit: Payer: 59 | Admitting: Internal Medicine

## 2019-06-09 ENCOUNTER — Encounter: Payer: Self-pay | Admitting: Internal Medicine

## 2019-06-11 ENCOUNTER — Encounter: Payer: Self-pay | Admitting: Internal Medicine

## 2019-06-15 ENCOUNTER — Other Ambulatory Visit: Payer: Self-pay

## 2019-06-15 ENCOUNTER — Ambulatory Visit (INDEPENDENT_AMBULATORY_CARE_PROVIDER_SITE_OTHER): Payer: 59 | Admitting: Internal Medicine

## 2019-06-15 ENCOUNTER — Encounter: Payer: Self-pay | Admitting: Internal Medicine

## 2019-06-15 DIAGNOSIS — R946 Abnormal results of thyroid function studies: Secondary | ICD-10-CM | POA: Insufficient documentation

## 2019-06-15 DIAGNOSIS — Z1389 Encounter for screening for other disorder: Secondary | ICD-10-CM

## 2019-06-15 DIAGNOSIS — Z0000 Encounter for general adult medical examination without abnormal findings: Secondary | ICD-10-CM | POA: Diagnosis not present

## 2019-06-15 DIAGNOSIS — N926 Irregular menstruation, unspecified: Secondary | ICD-10-CM | POA: Diagnosis not present

## 2019-06-15 DIAGNOSIS — E559 Vitamin D deficiency, unspecified: Secondary | ICD-10-CM

## 2019-06-15 DIAGNOSIS — Z13220 Encounter for screening for lipoid disorders: Secondary | ICD-10-CM

## 2019-06-15 NOTE — Progress Notes (Addendum)
Telephone Note failed audio poor connection  I connected with [place]  on 06/15/19 at  2:00 PM EDT by telephone and verified that I am speaking with the correct person using two identifiers.  Location patient: work  Environmental manager  Persons participating in the virtual visit: patient, provider  I discussed the limitations of evaluation and management by telemedicine and the availability of in person appointments. The patient expressed understanding and agreed to proceed.   HPI: 1. Abnormal thyroid tests 05/2019 with hyperthyroidism new appt with endocrine 06/23/19. She c/o fatigue though weight is stable with weight gain or stable not loss. She reports diarrhea after Flagyl recently for [facility] but better since eating yogurt  2. C/o fibroids noted and still spotting on OCP will CC Dani Gobble for further w/u  3. BV just completed Flagyl x 1 week 500 mg bid     ROS: See pertinent positives and negatives per HPI. General: +fatigue, wt stable  HEENT: no sore throat  CV: no chest pain  Lungs: no sob  GI: +slight diarrhea improving  GU: irregular menses  Skin: no issues  MSK: no issues  Neuro: no h/a  Psych: no anxiety/depression   Past Medical History:  Diagnosis Date  . Abnormal thyroid function test   . Allergy    pollen    Past Surgical History:  Procedure Laterality Date  . BREAST REDUCTION SURGERY Bilateral 2016  . PILONIDAL CYST EXCISION  2010  . WISDOM TOOTH EXTRACTION      Family History  Problem Relation Age of Onset  . Diabetes Father   . Breast cancer Other   . Heart murmur Mother   . GER disease Mother   . Ovarian cancer Neg Hx   . 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  Drospirenone (SLYND PO), Take by mouth., Disp: , Rfl:  .  ibuprofen (ADVIL) 800 MG tablet, Take 800 mg by mouth as needed., Disp: , Rfl:   EXAM: before  failed audio  VITALS per patient if applicable:  GENERAL: alert, oriented, appears well and in no acute distress  HEENT: atraumatic, conjunttiva clear, no obvious abnormalities on inspection of external nose and ears  NECK: normal movements of the head and neck  LUNGS: on inspection no signs of respiratory distress, breathing rate appears normal, no obvious gross SOB, gasping or wheezing  CV: no obvious cyanosis  MS: moves all visible extremities without noticeable abnormality  PSYCH/NEURO: pleasant and cooperative, no obvious depression or anxiety, speech and thought processing grossly intact  ASSESSMENT AND PLAN:  Discussed the following assessment and plan:  Annual physical exam - Plan:  Fasting labs  Flu shot utd  Tdap had 08/2018  hpv vaccine pt had per pt  Pap 05/21/19 negative no hpv reported had BV   Irregular menses - Plan: Estradiol, Progesterone  F/u with OB/GYN fibroid vs polyp noted on TVUS/pelvic CC Michelle Lawhorn   Abnormal thyroid function test - Plan: f/u endocrine 06/23/19   Will need to get DPR at f/u    I discussed the assessment and treatment plan with the patient. The patient was provided an opportunity to ask questions and all were answered. The patient agreed with the plan and demonstrated an understanding of the instructions.   The patient was advised to call back or seek an in-person evaluation if the symptoms worsen  or if the condition fails to improve as anticipated.  Time spent 25 minutes  Delorise Jackson, MD

## 2019-06-15 NOTE — Patient Instructions (Signed)

## 2019-06-17 ENCOUNTER — Ambulatory Visit: Payer: 59

## 2019-06-23 ENCOUNTER — Ambulatory Visit: Payer: 59 | Admitting: Internal Medicine

## 2019-06-23 ENCOUNTER — Encounter: Payer: Self-pay | Admitting: Internal Medicine

## 2019-06-23 ENCOUNTER — Other Ambulatory Visit: Payer: Self-pay

## 2019-06-23 VITALS — BP 118/72 | HR 65 | Temp 98.2°F | Ht 65.0 in | Wt 286.4 lb

## 2019-06-23 DIAGNOSIS — L83 Acanthosis nigricans: Secondary | ICD-10-CM | POA: Insufficient documentation

## 2019-06-23 DIAGNOSIS — R946 Abnormal results of thyroid function studies: Secondary | ICD-10-CM | POA: Diagnosis not present

## 2019-06-23 LAB — T4, FREE: Free T4: 0.79 ng/dL (ref 0.60–1.60)

## 2019-06-23 LAB — TSH: TSH: 0.8 u[IU]/mL (ref 0.35–4.50)

## 2019-06-23 NOTE — Patient Instructions (Signed)
-   Please stop by the lab today, will contact you with the results

## 2019-06-23 NOTE — Progress Notes (Signed)
Name: Margaret Harmon  MRN/ DOB: [id number], [birth year]    Age/ Sex: 28 y.o., female    PCP: McLean-Scocuzza, Nino Glow, MD   Reason for Endocrinology Evaluation: Hyperthyroidism     Date of Initial Endocrinology Evaluation: 06/23/2019     HPI: Ms. Margaret Harmon is a 28 y.o. female with unremarkable past medical history . The patient presented for initial endocrinology clinic visit on 06/23/2019 for consultative assistance with her Hyperthyroidism  Pt presented for an annual physician exam in 05/2019, when she was found to have low TSH at 0.352 uIU/mL ,with elevated total T4, she was having irregular periods at the time.     She has been on Drospirenone since 7/2    Today she denies weight loss, heat intolerance, palpitations, diarrhea, burning or itching in the eyes, anxiety or shaking in the eyes.  Denies viral syndrome prior to labs taken.   She denies any local neck symptoms .   No FH of thyroid disease  No Biotin intake.     HISTORY:  Past Medical History:  Past Medical History:  Diagnosis Date  . Abnormal thyroid function test   . Allergy    pollen   Past Surgical History:  Past Surgical History:  Procedure Laterality Date  . BREAST REDUCTION SURGERY Bilateral 2016  . PILONIDAL CYST EXCISION  2010  . WISDOM TOOTH EXTRACTION        Social History:  reports that she has never smoked. She has never used smokeless tobacco. She reports current alcohol use. She reports that she does not use drugs.  Family History: family history includes Breast cancer in an other family member; Diabetes in her father; GER disease in her mother; Heart murmur in her mother.   HOME MEDICATIONS: Allergies as of 06/23/2019   No Known Allergies     Medication List       Accurate as of June 23, 2019  7:58 AM. If you have any questions, ask your nurse or doctor.        ibuprofen 800 MG tablet Commonly known as: ADVIL Take 800 mg by mouth as needed.   SLYND PO Take by  mouth.         REVIEW OF SYSTEMS: A comprehensive ROS was conducted with the patient and is negative except as per HPI and below:  ROS     OBJECTIVE:  VS: BP 118/72 (BP Location: Right Arm, Patient Position: Sitting, Cuff Size: Normal)   Pulse 65   Temp 98.2 F (36.8 C)   Ht 5\' 5"  (1.651 m)   Wt 286 lb 6.4 oz (129.9 kg)   SpO2 98%   BMI 47.66 kg/m    Wt Readings from Last 3 Encounters:  06/23/19 286 lb 6.4 oz (129.9 kg)  06/03/19 287 lb 4.8 oz (130.3 kg)  05/21/19 282 lb 12.8 oz (128.3 kg)     EXAM: General: Pt appears well and is in NAD  Eyes: External eye exam normal without stare, lid lag or exophthalmos.  EOM intact.   Ears, Nose, Throat: Hearing: Grossly intact bilaterally Dental: Good dentition  Throat: Clear without mass, erythema or exudate  Neck: General: Supple without adenopathy. Thyroid: Thyroid size normal.  No goiter or nodules appreciated. No thyroid bruit.  Lungs: Clear with good BS bilat with no rales, rhonchi, or wheezes  Heart: Auscultation: RRR.  Abdomen: Normoactive bowel sounds, soft, nontender, without masses or organomegaly palpable  Extremities:  BL LE: No pretibial edema normal ROM and strength.  Skin: Hair: Texture and amount normal with gender appropriate distribution Skin Inspection: No rashes, + neck acanthosis nigricans Skin Palpation: Skin temperature, texture, and thickness normal to palpation  Neuro: Cranial nerves: II - XII grossly intact  Motor: Normal strength throughout DTRs: 2+ and symmetric in UE without delay in relaxation phase  Mental Status: Judgment, insight: Intact Orientation: Oriented to time, place, and person [place] and affect: No depression, anxiety, or agitation     DATA REVIEWED: Results for Margaret, Harmon (MRN [id number]) as of 06/23/2019 15:13  Ref. Range 05/21/2019 10:06 06/23/2019 08:13  TSH Latest Ref Range: 0.35 - 4.50 uIU/mL 0.352 (L) 0.80  T4,Free(Direct) Latest Ref Range: 0.60 - 1.60 ng/dL  0.79   Thyroxine (T4) Latest Ref Range: 4.5 - 12.0 ug/dL 15.1 (H)      Results for Margaret, Harmon (MRN [id number]) as of 06/23/2019 07:32  Ref. Range 05/21/2019 10:06  TSH Latest Ref Range: 0.450 - 4.500 uIU/mL 0.352 (L)  Thyroxine (T4) Latest Ref Range: 4.5 - 12.0 ug/dL 15.1 (H)  Free Thyroxine Index Latest Ref Range: 1.2 - 4.9  7.6 (H)  T3 Uptake Ratio Latest Ref Range: 24 - 39 % 50 (H)   ASSESSMENT/PLAN/RECOMMENDATIONS:   1. Abnormal Thyroid tests:   - Clinically she is euthyroid  - Repeat testing today is normal , most likely there's a lab interference from [facility] , another explanation is she had a recovering thyroiditis . - I have advised her not to have TFT's sent to [facility] at this time, may use [facility] if needed by other providers.     2. Irregular Periods/ Morbid Obesity/ Acanthosis Nigricans:  - Her irregular periods are NOT related to thyroid  - Most likely this is related to her weight gain - I have counseled her about her diet, regular exercise and avoiding sugar-sweetened beverages, we also discussed eating 2-3 meals a day and avoiding snacks.  - We discussed she is exhibiting features of insulin-resistance and is at high risk for developing diabetes .  - She is currently on progestin only contraception.     F/u in 3 months    Addendum: Labs discussed with the patient on 06/23/19 @ 1515   Signed electronically by: Mack Guise, MD  [facility]  [facility] [address] [place], [place] [address] Phone: [phone] FAX: [phone]   CC: McLean-Scocuzza, Nino Glow, MD [place] [address] Phone: [phone] Fax: [phone]   Return to Endocrinology clinic as below: Future Appointments  Date Time Provider [place]  09/21/2019  2:00 PM McLean-Scocuzza, Nino Glow, MD [id number]

## 2019-06-25 ENCOUNTER — Other Ambulatory Visit: Payer: Self-pay

## 2019-06-25 ENCOUNTER — Encounter: Payer: Self-pay | Admitting: Certified Nurse Midwife

## 2019-06-25 MED ORDER — SLYND 4 MG PO TABS: 1.0000 | ORAL_TABLET | Freq: Every day | ORAL | 10 refills | Status: DC

## 2019-06-26 LAB — T3: T3, Total: 135 ng/dL (ref 76–181)

## 2019-06-26 LAB — TRAB (TSH RECEPTOR BINDING ANTIBODY): TRAB: 1.59 IU/L (ref <=2.00)

## 2019-06-30 ENCOUNTER — Other Ambulatory Visit: Payer: 59

## 2019-07-05 ENCOUNTER — Encounter: Payer: Self-pay | Admitting: Certified Nurse Midwife

## 2019-07-05 ENCOUNTER — Ambulatory Visit: Payer: 59 | Admitting: Certified Nurse Midwife

## 2019-07-05 ENCOUNTER — Other Ambulatory Visit: Payer: Self-pay

## 2019-07-05 VITALS — BP 111/67 | HR 68 | Ht 65.0 in | Wt 283.7 lb

## 2019-07-05 DIAGNOSIS — Z30017 Encounter for initial prescription of implantable subdermal contraceptive: Secondary | ICD-10-CM | POA: Diagnosis not present

## 2019-07-05 DIAGNOSIS — N921 Excessive and frequent menstruation with irregular cycle: Secondary | ICD-10-CM | POA: Diagnosis not present

## 2019-07-05 DIAGNOSIS — Z975 Presence of (intrauterine) contraceptive device: Secondary | ICD-10-CM | POA: Diagnosis not present

## 2019-07-05 NOTE — Progress Notes (Signed)
Patient here to discuss recent lab results and possibly switch OCP.  Patient c/o BTB last month and still continues.

## 2019-07-05 NOTE — Progress Notes (Signed)
GYN ENCOUNTER NOTE  Subjective:       Margaret Harmon is a 28 y.o. G0P0000 female is here for gynecologic evaluation of the following issues:  1. Breakthrough bleeding on oral contraceptive for the last month  Endocrinology appointment on 06/23/2019 with normal findings.   Denies difficulty breathing or respiratory distress, chest pain, abdominal pain, excessive vaginal bleeding, dysuria, and leg pain or swelling.    Gynecologic History  Patient's last menstrual period was 06/30/2019 (exact date). Period Pattern: (!) Irregular Menstrual Flow: Moderate Menstrual Control: Maxi pad Dysmenorrhea: (!) Moderate Dysmenorrhea Symptoms: Cramping  Contraception: oral progesterone-only contraceptive, Slynd  Last Pap: 05/2019. Results were: normal  Obstetric History  OB History  Gravida Para Term Preterm AB Living  0 0 0 0 0 0  SAB TAB Ectopic Multiple Live Births  0 0 0 0 0    Past Medical History:  Diagnosis Date  . Abnormal thyroid function test   . Allergy    pollen    Past Surgical History:  Procedure Laterality Date  . BREAST REDUCTION SURGERY Bilateral 2016  . PILONIDAL CYST EXCISION  2010  . WISDOM TOOTH EXTRACTION      Current Outpatient Medications on File Prior to Visit  Medication Sig Dispense Refill  . ibuprofen (ADVIL) 800 MG tablet Take 800 mg by mouth as needed.    . Drospirenone (SLYND) 4 MG TABS Take 1 tablet by mouth daily. (Patient not taking: Reported on 07/05/2019) 28 tablet 10   No current facility-administered medications on file prior to visit.     No Known Allergies  Social History   Socioeconomic History  . Marital status: Single    Spouse name: Not on file  . Number of children: Not on file  . Years of education: Not on file  . Highest education level: Not on file  Occupational History  . Not on file  Social Needs  . Financial resource strain: Not on file  . Food insecurity    Worry: Not on file    Inability: Not on file  .  Transportation needs    Medical: Not on file    Non-medical: Not on file  Tobacco Use  . Smoking status: Never Smoker  . Smokeless tobacco: Never Used  Substance and Sexual Activity  . Alcohol use: Yes    Comment: occasional   . Drug use: Never  . Sexual activity: Not Currently    Birth control/protection: None  Lifestyle  . Physical activity    Days per week: Not on file    Minutes per session: Not on file  . Stress: Not on file  Relationships  . Social Herbalist on phone: Not on file    Gets together: Not on file    Attends religious service: Not on file    Active member of club or organization: Not on file    Attends meetings of clubs or organizations: Not on file    Relationship status: Not on file  . Intimate partner violence    Fear of current or ex partner: Not on file    Emotionally abused: Not on file    Physically abused: Not on file    Forced sexual activity: Not on file  Other Topics Concern  . 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  Diabetes Father   . Breast cancer Other   . Heart murmur Mother   . GER disease Mother   . Ovarian cancer Neg Hx   . Colon cancer Neg Hx     The following portions of the patient's history were reviewed and updated as appropriate: allergies, current medications, past family history, past medical history, past social history, past surgical history and problem list.  Review of Systems  ROS negative except as noted above. Information obtained from patient.   Objective:   BP 111/67   Pulse 68   Ht 5\' 5"  (1.651 m)   Wt 283 lb 11.2 oz (128.7 kg)   LMP 06/30/2019 (Exact Date)   BMI 47.21 kg/m    CONSTITUTIONAL: Well-developed, well-nourished female in no acute distress.   PHYSICAL EXAM: Not indicated.   Assessment:   1. Breakthrough bleeding on birth control  pills   Plan:   Desires Nexplanon insertion, see note below  Reviewed red flag symptoms and when to call  RTC as needed  Diona Fanti, CNM [facility], [facility] 07/05/19 11:01 AM   Nexplanon Insertion Note  Margaret Harmon is a 28 y.o. year old African American female here for Nexplanon insertion.  Patient's last menstrual period was 06/30/2019 (exact date).  Risks/benefits/side effects of Nexplanon have been discussed and her questions have been answered.  Specifically, a failure rate of 12/998 has been reported, with an increased failure rate if pt takes [place] and/or antiseizure medicaitons.  [place] is aware of the common side effect of irregular bleeding, which the incidence of decreases over time.  BP 111/67   Pulse 68   Ht 5\' 5"  (1.651 m)   Wt 283 lb 11.2 oz (128.7 kg)   LMP 06/30/2019 (Exact Date)   BMI 47.21 kg/m   She is left-handed, so her right arm, approximately 4 inches proximal from the elbow, was cleansed with alcohol and anesthetized with 2cc of 2% Lidocaine.  The area was cleansed again with betadine and the Nexplanon was inserted per manufacturer's recommendations without difficulty.  A steri-strip and pressure bandage were applied.  Pt was instructed to keep the area clean and dry, remove pressure bandage in 24 hours, and keep insertion site covered with the steri-strip for 3-5 days.  Back up contraception was recommended for 2 weeks.  She was given a card indicating date Nexplanon was inserted and date it needs to be removed.   RTC as needed  Diona Fanti, CNM [facility], [facility] 07/05/19 11:21 AM  NDC: 1607-3710-62 Lot: [id number] Exp: 07/2021

## 2019-07-05 NOTE — Patient Instructions (Signed)

## 2019-09-13 ENCOUNTER — Ambulatory Visit: Payer: 59 | Admitting: Internal Medicine

## 2019-09-17 ENCOUNTER — Other Ambulatory Visit: Payer: Self-pay

## 2019-09-21 ENCOUNTER — Encounter: Payer: Self-pay | Admitting: Internal Medicine

## 2019-09-21 ENCOUNTER — Other Ambulatory Visit: Payer: Self-pay

## 2019-09-21 ENCOUNTER — Ambulatory Visit: Payer: 59 | Admitting: Internal Medicine

## 2019-09-21 VITALS — BP 134/78 | HR 81 | Temp 98.0°F | Ht 65.0 in | Wt 287.2 lb

## 2019-09-21 DIAGNOSIS — Z6841 Body Mass Index (BMI) 40.0 and over, adult: Secondary | ICD-10-CM | POA: Diagnosis not present

## 2019-09-21 DIAGNOSIS — Z13220 Encounter for screening for lipoid disorders: Secondary | ICD-10-CM | POA: Diagnosis not present

## 2019-09-21 DIAGNOSIS — R739 Hyperglycemia, unspecified: Secondary | ICD-10-CM | POA: Diagnosis not present

## 2019-09-21 DIAGNOSIS — Z1389 Encounter for screening for other disorder: Secondary | ICD-10-CM | POA: Diagnosis not present

## 2019-09-21 DIAGNOSIS — R0781 Pleurodynia: Secondary | ICD-10-CM

## 2019-09-21 DIAGNOSIS — R091 Pleurisy: Secondary | ICD-10-CM

## 2019-09-21 DIAGNOSIS — R109 Unspecified abdominal pain: Secondary | ICD-10-CM

## 2019-09-21 DIAGNOSIS — E559 Vitamin D deficiency, unspecified: Secondary | ICD-10-CM

## 2019-09-21 DIAGNOSIS — R0789 Other chest pain: Secondary | ICD-10-CM

## 2019-09-21 MED ORDER — PHENTERMINE HCL 37.5 MG PO TABS: 37.5000 mg | ORAL_TABLET | Freq: Every day | ORAL | 0 refills | Status: DC

## 2019-09-21 NOTE — Patient Instructions (Addendum)
pepcid or tums  Nexium, prilosec last over the counter   Think about ultrasound of abdomen and left rib Xray   Monitor blood pressure on appetite suppressant   Gastroesophageal Reflux Disease, Adult Gastroesophageal reflux (GER) happens when acid from the stomach flows up into the tube that connects the mouth and the stomach (esophagus). Normally, food travels down the esophagus and stays in the stomach to be digested. However, when a person has GER, food and stomach acid sometimes move back up into the esophagus. If this becomes a more serious problem, the person may be diagnosed with a disease called gastroesophageal reflux disease (GERD). GERD occurs when the reflux:  Happens often.  Causes frequent or severe symptoms.  Causes problems such as damage to the esophagus. When stomach acid comes in contact with the esophagus, the acid may cause soreness (inflammation) in the esophagus. Over time, GERD may create small holes (ulcers) in the lining of the esophagus. What are the causes? This condition is caused by a problem with the muscle between the esophagus and the stomach (lower esophageal sphincter, or LES). Normally, the LES muscle closes after food passes through the esophagus to the stomach. When the LES is weakened or abnormal, it does not close properly, and that allows food and stomach acid to go back up into the esophagus. The LES can be weakened by certain dietary substances, medicines, and medical conditions, including:  Tobacco use.  Pregnancy.  Having a hiatal hernia.  Alcohol use.  Certain foods and beverages, such as coffee, chocolate, onions, and peppermint. What increases the risk? You are more likely to develop this condition if you:  Have an increased body weight.  Have a connective tissue disorder.  Use NSAID medicines. What are the signs or symptoms? Symptoms of this condition include:  Heartburn.  Difficult or painful swallowing.  The feeling of  having a lump in the throat.  Abitter taste in the mouth.  Bad breath.  Having a large amount of saliva.  Having an upset or bloated stomach.  Belching.  Chest pain. Different conditions can cause chest pain. Make sure you see your health care provider if you experience chest pain.  Shortness of breath or wheezing.  Ongoing (chronic) cough or a night-time cough.  Wearing away of tooth enamel.  Weight loss. How is this diagnosed? Your health care provider will take a medical history and perform a physical exam. To determine if you have mild or severe GERD, your health care provider may also monitor how you respond to treatment. You may also have tests, including:  A test to examine your stomach and esophagus with a small camera (endoscopy).  A test thatmeasures the acidity level in your esophagus.  A test thatmeasures how much pressure is on your esophagus.  A barium swallow or modified barium swallow test to show the shape, size, and functioning of your esophagus. How is this treated? The goal of treatment is to help relieve your symptoms and to prevent complications. Treatment for this condition may vary depending on how severe your symptoms are. Your health care provider may recommend:  Changes to your diet.  Medicine.  Surgery. Follow these instructions at home: Eating and drinking   Follow a diet as recommended by your health care provider. This may involve avoiding foods and drinks such as: ? Coffee and tea (with or without caffeine). ? Drinks that containalcohol. ? Energy drinks and sports drinks. ? Carbonated drinks or sodas. ? Chocolate and cocoa. ? Peppermint and  mint flavorings. ? Garlic and onions. ? Horseradish. ? Spicy and acidic foods, including peppers, chili powder, curry powder, vinegar, hot sauces, and barbecue sauce. ? Citrus fruit juices and citrus fruits, such as oranges, lemons, and limes. ? Tomato-based foods, such as red sauce, chili,  salsa, and pizza with red sauce. ? Fried and fatty foods, such as donuts, french fries, potato chips, and high-fat dressings. ? High-fat meats, such as hot dogs and fatty cuts of red and white meats, such as rib eye steak, sausage, ham, and bacon. ? High-fat dairy items, such as whole milk, butter, and cream cheese.  Eat small, frequent meals instead of large meals.  Avoid drinking large amounts of liquid with your meals.  Avoid eating meals during the 2-3 hours before bedtime.  Avoid lying down right after you eat.  Do not exercise right after you eat. Lifestyle   Do not use any products that contain nicotine or tobacco, such as cigarettes, e-cigarettes, and chewing tobacco. If you need help quitting, ask your health care provider.  Try to reduce your stress by using methods such as yoga or meditation. If you need help reducing stress, ask your health care provider.  If you are overweight, reduce your weight to an amount that is healthy for you. Ask your health care provider for guidance about a safe weight loss goal. General instructions  Pay attention to any changes in your symptoms.  Take over-the-counter and prescription medicines only as told by your health care provider. Do not take aspirin, ibuprofen, or other NSAIDs unless your health care provider told you to do so.  Wear loose-fitting clothing. Do not wear anything tight around your waist that causes pressure on your abdomen.  Raise (elevate) the head of your bed about 6 inches (15 cm).  Avoid bending over if this makes your symptoms worse.  Keep all follow-up visits as told by your health care provider. This is important. Contact a health care provider if:  You have: ? New symptoms. ? Unexplained weight loss. ? Difficulty swallowing or it hurts to swallow. ? Wheezing or a persistent cough. ? A hoarse voice.  Your symptoms do not improve with treatment. Get help right away if you:  Have pain in your arms,  neck, jaw, teeth, or back.  Feel sweaty, dizzy, or light-headed.  Have chest pain or shortness of breath.  Vomit and your vomit looks like blood or coffee grounds.  Faint.  Have stool that is bloody or black.  Cannot swallow, drink, or eat. Summary  Gastroesophageal reflux happens when acid from the stomach flows up into the esophagus. GERD is a disease in which the reflux happens often, causes frequent or severe symptoms, or causes problems such as damage to the esophagus.  Treatment for this condition may vary depending on how severe your symptoms are. Your health care provider may recommend diet and lifestyle changes, medicine, or surgery.  Contact a health care provider if you have new or worsening symptoms.  Take over-the-counter and prescription medicines only as told by your health care provider. Do not take aspirin, ibuprofen, or other NSAIDs unless your health care provider told you to do so.  Keep all follow-up visits as told by your health care provider. This is important. This information is not intended to replace advice given to you by your health care provider. Make sure you discuss any questions you have with your health care provider. Document Released: 08/28/2005 Document Revised: 05/27/2018 Document Reviewed: 05/27/2018 Elsevier Patient Education  [place] for Gastroesophageal Reflux Disease, Adult When you have gastroesophageal reflux disease (GERD), the foods you eat and your eating habits are very important. Choosing the right foods can help ease the discomfort of GERD. Consider working with a diet and nutrition specialist (dietitian) to help you make healthy food choices. What general guidelines should I follow?  Eating plan  Choose healthy foods low in fat, such as fruits, vegetables, whole grains, low-fat dairy products, and lean meat, fish, and poultry.  Eat frequent, small meals instead of three large meals each day. Eat your  meals slowly, in a relaxed setting. Avoid bending over or lying down until 2-3 hours after eating.  Limit high-fat foods such as fatty meats or fried foods.  Limit your intake of oils, butter, and shortening to less than 8 teaspoons each day.  Avoid the following: ? Foods that cause symptoms. These may be different for different people. Keep a food diary to keep track of foods that cause symptoms. ? Alcohol. ? Drinking large amounts of liquid with meals. ? Eating meals during the 2-3 hours before bed.  Cook foods using methods other than frying. This may include baking, grilling, or broiling. Lifestyle  Maintain a healthy weight. Ask your health care provider what weight is healthy for you. If you need to lose weight, work with your health care provider to do so safely.  Exercise for at least 30 minutes on 5 or more days each week, or as told by your health care provider.  Avoid wearing clothes that fit tightly around your waist and chest.  Do not use any products that contain nicotine or tobacco, such as cigarettes and e-cigarettes. If you need help quitting, ask your health care provider.  Sleep with the head of your bed raised. Use a wedge under the mattress or blocks under the bed frame to raise the head of the bed. What foods are not recommended? The items listed may not be a complete list. Talk with your dietitian about what dietary choices are best for you. Grains Pastries or quick breads with added fat. [place] toast. Vegetables Deep fried vegetables. [place] fries. Any vegetables prepared with added fat. Any vegetables that cause symptoms. For some people this may include tomatoes and tomato products, chili peppers, onions and garlic, and horseradish. Fruits Any fruits prepared with added fat. Any fruits that cause symptoms. For some people this may include citrus fruits, such as oranges, grapefruit, pineapple, and lemons. Meats and other protein foods High-fat meats, such as  fatty beef or pork, hot dogs, ribs, ham, sausage, salami and bacon. Fried meat or protein, including fried fish and fried chicken. Nuts and nut butters. Dairy Whole milk and chocolate milk. Sour cream. Cream. Ice cream. Cream cheese. Milk shakes. Beverages Coffee and tea, with or without caffeine. Carbonated beverages. Sodas. Energy drinks. Fruit juice made with acidic fruits (such as orange or grapefruit). Tomato juice. Alcoholic drinks. Fats and oils Butter. Margarine. Shortening. Ghee. Sweets and desserts Chocolate and cocoa. Donuts. Seasoning and other foods Pepper. Peppermint and spearmint. Any condiments, herbs, or seasonings that cause symptoms. For some people, this may include curry, hot sauce, or vinegar-based salad dressings. Summary  When you have gastroesophageal reflux disease (GERD), food and lifestyle choices are very important to help ease the discomfort of GERD.  Eat frequent, small meals instead of three large meals each day. Eat your meals slowly, in a relaxed setting. Avoid bending over or lying down until 2-3 hours  after eating.  Limit high-fat foods such as fatty meat or fried foods. This information is not intended to replace advice given to you by your health care provider. Make sure you discuss any questions you have with your health care provider. Document Released: 11/18/2005 Document Revised: 03/11/2019 Document Reviewed: 11/19/2016 Elsevier Patient Education  2020 [employer].

## 2019-09-21 NOTE — Progress Notes (Signed)
Chief Complaint  Patient presents with  . Follow-up   F/u  1. C/o ruq ab pain and luq ab pain x months near left rib pain is intermittent right sided worse with spicy foods 10/10 when comes. She tries to apply pressure when it comes and helps. Sx's x 2 years. Denies pleurisy, sob. She reports h/o costochondritis. She reports has had egd in the past and negative. Nothing else tried. Pain does not radiate  2. Obesity has tried to change diet recently and wants to get back on adipex she has tried before x 3 months and helped lose wt goal is 20 to 30 lbs down from 287 for now   Review of Systems  Constitutional: Negative for weight loss.  HENT: Negative for hearing loss.   Eyes: Negative for blurred vision.  Respiratory: Negative for shortness of breath.   Cardiovascular: Negative for chest pain.  Gastrointestinal: Positive for abdominal pain. Negative for blood in stool.  Musculoskeletal: Negative for falls.  Skin: Negative for rash.  Neurological: Negative for headaches.  Psychiatric/Behavioral: Negative for depression.   Past Medical History:  Diagnosis Date  . Abnormal thyroid function test   . Allergy    pollen   Past Surgical History:  Procedure Laterality Date  . BREAST REDUCTION SURGERY Bilateral 2016  . PILONIDAL CYST EXCISION  2010  . WISDOM TOOTH EXTRACTION     Family History  Problem Relation Age of Onset  . Diabetes Father   . Breast cancer Other   . Heart murmur Mother   . GER disease Mother   . Ovarian cancer Neg Hx   . Colon cancer Neg Hx    Social History   Socioeconomic History  . Marital status: Single    Spouse name: Not on file  . Number of children: Not on file  . Years of education: Not on file  . Highest education level: Not on file  Occupational History  . Not on file  Social Needs  . Financial resource strain: Not on file  . Food insecurity    Worry: Not on file    Inability: Not on file  . Transportation needs    Medical: Not on file   Non-medical: Not on file  Tobacco Use  . Smoking status: Never Smoker  . Smokeless tobacco: Never Used  Substance and Sexual Activity  . Alcohol use: Yes    Comment: occasional   . Drug use: Never  . Sexual activity: Not Currently    Birth control/protection: None  Lifestyle  . Physical activity    Days per week: Not on file    Minutes per session: Not on file  . Stress: Not on file  Relationships  . Social Herbalist on phone: Not on file    Gets together: Not on file    Attends religious service: Not on file    Active member of club or organization: Not on file    Attends meetings of clubs or organizations: Not on file    Relationship status: Not on file  . Intimate partner violence    Fear of current or ex partner: Not on file    Emotionally abused: Not on file    Physically abused: Not on file    Forced sexual activity: Not on file  Other Topics Concern  . 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 etonogestrel (NEXPLANON) 68 MG IMPL implant 1 each by Subdermal route once.  Marland Kitchen ibuprofen (ADVIL) 800 MG tablet Take 800 mg by mouth as needed.   No Known Allergies No results found for this or any previous visit (from the past 2160 hour(s)). Objective  Body mass index is 47.79 kg/m. Wt Readings from Last 3 Encounters:  09/21/19 287 lb 3.2 oz (130.3 kg)  07/05/19 283 lb 11.2 oz (128.7 kg)  06/23/19 286 lb 6.4 oz (129.9 kg)   Temp Readings from Last 3 Encounters:  09/21/19 98 F (36.7 C) (Skin)  06/23/19 98.2 F (36.8 C)   BP Readings from Last 3 Encounters:  09/21/19 134/78  07/05/19 111/67  06/23/19 118/72   Pulse Readings from Last 3 Encounters:  09/21/19 81  07/05/19 68  06/23/19 65    Physical Exam Vitals signs and nursing note reviewed.  Constitutional:      Appearance: Normal appearance. She is  well-developed and well-groomed. She is obese.  HENT:     Head: Normocephalic and atraumatic.     Comments: +mask on   Eyes:     Conjunctiva/sclera: Conjunctivae normal.     Pupils: Pupils are equal, round, and reactive to light.  Cardiovascular:     Rate and Rhythm: Normal rate and regular rhythm.     Heart sounds: Normal heart sounds. No murmur.  Pulmonary:     Effort: Pulmonary effort is normal.     Breath sounds: Normal breath sounds.  Abdominal:     General: Abdomen is flat. Bowel sounds are normal.     Tenderness: There is no abdominal tenderness.  Skin:    General: Skin is warm and dry.  Neurological:     General: No focal deficit present.     Mental Status: She is alert and oriented to person, place, and time. Mental status is at baseline.     Gait: Gait normal.  Psychiatric:        Attention and Perception: Attention and perception normal.        [place] and Affect: [place] and affect normal.        Speech: Speech normal.        Behavior: Behavior normal. Behavior is cooperative.        Thought Content: Thought content normal.        Cognition and Memory: Cognition and memory normal.        Judgment: Judgment normal.     Assessment  Plan  Abdominal pain, unspecified abdominal location RUQ and LUQ ? Etiology does not seem like pleurisy with nexplanon r/o PE with D dimer r/u GI etiology with lipase I.e pancreatitis consider GS vs MSK I.e rib etiology- Plan: Comprehensive metabolic panel, Lipase If w/u negative consider left rib Xray and US abdomen pt agreeable to b/w only 1st  Rib pain on left side (see above) - Plan: D-Dimer, Quantitative Pleurisy - Plan: D-Dimer, Quantitative  Morbid obesity ([place]) - Plan: phentermine (ADIPEX-P) 37.5 MG tablet x 2 months and f/u in 2 months to see if losing  rec exercise and diet changes  Monitor bp   HM Flu shot utd 08/30/19  Tdap had 08/2018  hpv vaccine pt had per pt  Pap 05/21/19 negative no hpv reported had BV lmp 06/30/19 right  arm nexplanon 07/2019   Irregular menses - Plan: Estradiol, Progesterone  F/u with OB/GYN fibroid vs polyp noted on TVUS/pelvic CC Michelle Lawhorn   Abnormal  thyroid function test normalized with repeat labs   Provider: Dr. Olivia Mackie McLean-Scocuzza-[facility]

## 2019-09-22 DIAGNOSIS — R109 Unspecified abdominal pain: Secondary | ICD-10-CM | POA: Insufficient documentation

## 2019-09-22 DIAGNOSIS — Z6841 Body Mass Index (BMI) 40.0 and over, adult: Secondary | ICD-10-CM | POA: Insufficient documentation

## 2019-09-27 ENCOUNTER — Other Ambulatory Visit (INDEPENDENT_AMBULATORY_CARE_PROVIDER_SITE_OTHER): Payer: 59

## 2019-09-27 ENCOUNTER — Other Ambulatory Visit: Payer: Self-pay

## 2019-09-27 ENCOUNTER — Other Ambulatory Visit: Payer: 59

## 2019-09-27 DIAGNOSIS — R109 Unspecified abdominal pain: Secondary | ICD-10-CM

## 2019-09-27 DIAGNOSIS — Z13220 Encounter for screening for lipoid disorders: Secondary | ICD-10-CM | POA: Diagnosis not present

## 2019-09-27 DIAGNOSIS — R0781 Pleurodynia: Secondary | ICD-10-CM | POA: Diagnosis not present

## 2019-09-27 DIAGNOSIS — E559 Vitamin D deficiency, unspecified: Secondary | ICD-10-CM | POA: Diagnosis not present

## 2019-09-27 DIAGNOSIS — R091 Pleurisy: Secondary | ICD-10-CM

## 2019-09-27 DIAGNOSIS — N926 Irregular menstruation, unspecified: Secondary | ICD-10-CM

## 2019-09-27 DIAGNOSIS — R739 Hyperglycemia, unspecified: Secondary | ICD-10-CM

## 2019-09-27 DIAGNOSIS — R946 Abnormal results of thyroid function studies: Secondary | ICD-10-CM

## 2019-09-27 DIAGNOSIS — Z1389 Encounter for screening for other disorder: Secondary | ICD-10-CM

## 2019-09-27 LAB — LIPID PANEL
Cholesterol: 187 mg/dL (ref 0–200)
HDL: 49.1 mg/dL (ref >39.00)
LDL Cholesterol: 124 mg/dL — ABNORMAL HIGH (ref 0–99)
NonHDL: 138.07
Total CHOL/HDL Ratio: 4
Triglycerides: 72 mg/dL (ref 0.0–149.0)
VLDL: 14.4 mg/dL (ref 0.0–40.0)

## 2019-09-27 LAB — TSH: TSH: 0.84 u[IU]/mL (ref 0.35–4.50)

## 2019-09-27 LAB — VITAMIN D 25 HYDROXY (VIT D DEFICIENCY, FRACTURES): VITD: 9.8 ng/mL — ABNORMAL LOW (ref 30.00–100.00)

## 2019-09-27 LAB — COMPREHENSIVE METABOLIC PANEL
ALT: 19 U/L (ref 0–35)
AST: 19 U/L (ref 0–37)
Albumin: 4.3 g/dL (ref 3.5–5.2)
Alkaline Phosphatase: 58 U/L (ref 39–117)
BUN: 9 mg/dL (ref 6–23)
CO2: 24 mEq/L (ref 19–32)
Calcium: 9.8 mg/dL (ref 8.4–10.5)
Chloride: 105 mEq/L (ref 96–112)
Creatinine, Ser: 0.85 mg/dL (ref 0.40–1.20)
GFR: 96.63 mL/min (ref >60.00)
Glucose, Bld: 84 mg/dL (ref 70–99)
Potassium: 3.9 mEq/L (ref 3.5–5.1)
Sodium: 138 mEq/L (ref 135–145)
Total Bilirubin: 0.5 mg/dL (ref 0.2–1.2)
Total Protein: 7.9 g/dL (ref 6.0–8.3)

## 2019-09-27 LAB — T3, FREE: T3, Free: 4.4 pg/mL — ABNORMAL HIGH (ref 2.3–4.2)

## 2019-09-27 LAB — LIPASE: Lipase: 10 U/L — ABNORMAL LOW (ref 11.0–59.0)

## 2019-09-27 LAB — HEMOGLOBIN A1C: Hgb A1c MFr Bld: 5.5 % (ref 4.6–6.5)

## 2019-09-27 LAB — T4, FREE: Free T4: 0.84 ng/dL (ref 0.60–1.60)

## 2019-09-27 LAB — D-DIMER, QUANTITATIVE: D-Dimer, Quant: 0.57 mcg/mL FEU — ABNORMAL HIGH (ref <0.50)

## 2019-09-28 LAB — URINALYSIS, ROUTINE W REFLEX MICROSCOPIC
Bilirubin Urine: NEGATIVE
Glucose, UA: NEGATIVE
Hgb urine dipstick: NEGATIVE
Leukocytes,Ua: NEGATIVE
Nitrite: NEGATIVE
Protein, ur: NEGATIVE
Specific Gravity, Urine: 1.019 (ref 1.001–1.03)
pH: 6 (ref 5.0–8.0)

## 2019-09-28 LAB — PROGESTERONE: Progesterone: <0.5 ng/mL

## 2019-09-28 LAB — ESTRADIOL: Estradiol: 42 pg/mL

## 2019-09-29 ENCOUNTER — Other Ambulatory Visit: Payer: Self-pay | Admitting: Internal Medicine

## 2019-09-29 ENCOUNTER — Encounter: Payer: Self-pay | Admitting: Internal Medicine

## 2019-09-29 DIAGNOSIS — E559 Vitamin D deficiency, unspecified: Secondary | ICD-10-CM | POA: Insufficient documentation

## 2019-09-29 MED ORDER — CHOLECALCIFEROL 1.25 MG (50000 UT) PO CAPS: 50000.0000 [IU] | ORAL_CAPSULE | ORAL | 1 refills | Status: DC

## 2019-09-30 ENCOUNTER — Other Ambulatory Visit: Payer: Self-pay | Admitting: Internal Medicine

## 2019-09-30 DIAGNOSIS — R0789 Other chest pain: Secondary | ICD-10-CM

## 2019-09-30 DIAGNOSIS — R0781 Pleurodynia: Secondary | ICD-10-CM

## 2019-09-30 DIAGNOSIS — R7989 Other specified abnormal findings of blood chemistry: Secondary | ICD-10-CM

## 2019-10-05 ENCOUNTER — Ambulatory Visit
Admission: RE | Admit: 2019-10-05 | Discharge: 2019-10-05 | Disposition: A | Discharge status: Home / Self Care | Payer: 59 | Source: Ambulatory Visit | Attending: Internal Medicine | Admitting: Internal Medicine

## 2019-10-05 ENCOUNTER — Telehealth: Payer: Self-pay | Admitting: *Deleted

## 2019-10-05 ENCOUNTER — Other Ambulatory Visit: Payer: Self-pay

## 2019-10-05 DIAGNOSIS — R600 Localized edema: Secondary | ICD-10-CM | POA: Diagnosis not present

## 2019-10-05 DIAGNOSIS — R7989 Other specified abnormal findings of blood chemistry: Secondary | ICD-10-CM | POA: Diagnosis not present

## 2019-10-05 DIAGNOSIS — R0789 Other chest pain: Secondary | ICD-10-CM | POA: Diagnosis not present

## 2019-10-05 DIAGNOSIS — R0781 Pleurodynia: Secondary | ICD-10-CM

## 2019-10-05 MED ORDER — IOHEXOL 350 MG/ML SOLN
75.0000 mL | Freq: Once | INTRAVENOUS | Status: AC | PRN
  Administered 2019-10-05: 75 mL via INTRAVENOUS

## 2019-10-05 NOTE — Telephone Encounter (Signed)
Copied from [place] [phone]. Topic: General - Other >> Oct 05, 2019  8:06 AM Celene Kras A wrote: Reason for CRM: Butch Penny, from [facility] ultrasound, called to give a report on pts bilateral leg dopplers was negative for DVT. Please see notes in chart. Please advise. [phone]

## 2019-11-19 IMAGING — CT CT ANGIO CHEST
1 of 3 series · 19 of 36 positions shown · IV contrast (APPLIED)
Comparison: None.

CLINICAL DATA: Chest pain with elevated D-dimer

EXAM:
CT ANGIOGRAPHY CHEST WITH CONTRAST
TECHNIQUE: Multidetector CT imaging of the chest was performed using the
standard protocol during bolus administration of intravenous
contrast. Multiplanar CT image reconstructions and MIPs were
obtained to evaluate the vascular anatomy.
CONTRAST:  75mL OMNIPAQUE IOHEXOL 350 MG/ML SOLN

[Series 4: axial st · axial · 0.64mm/px · z∈[-295,-67]mm · 19 of 82 slices shown]
[im 3/82  lung]
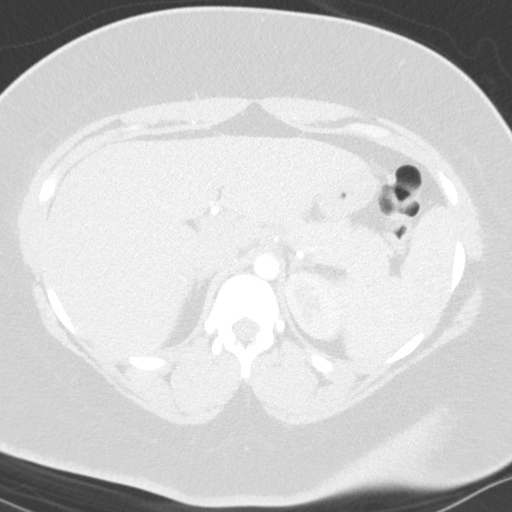
[im 8/82  soft-tissue]
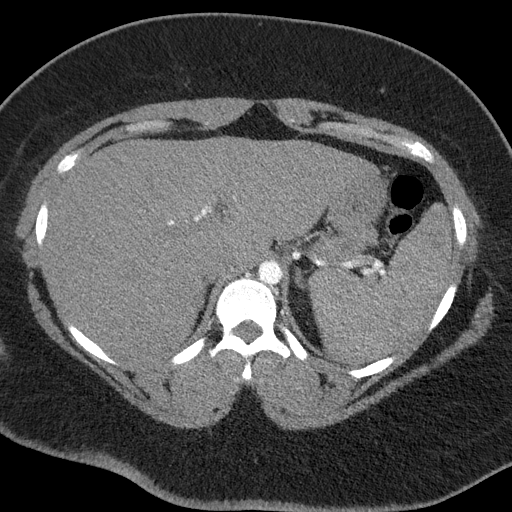
[im 11/82  lung]
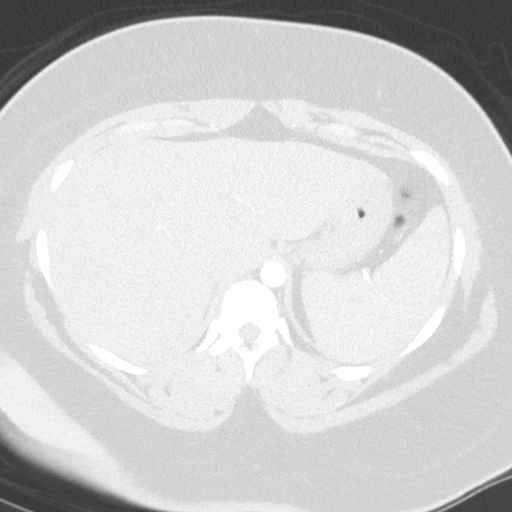
[im 16/82  soft-tissue]
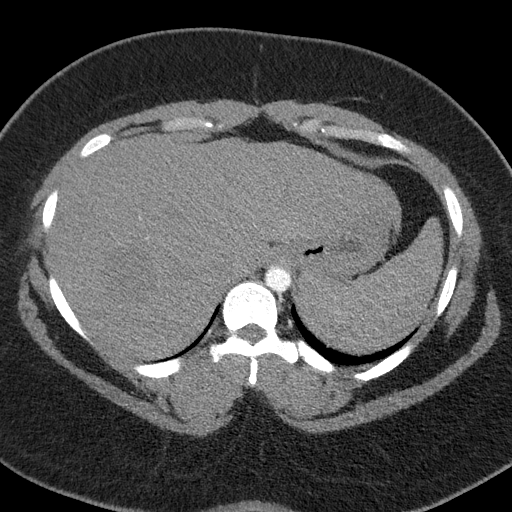
[im 21/82  lung]
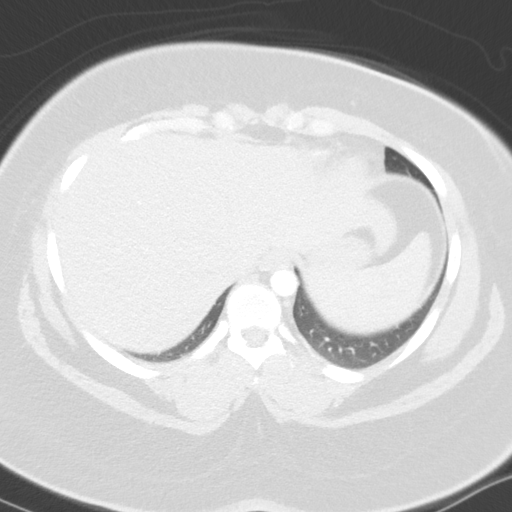
[im 24/82  soft-tissue]
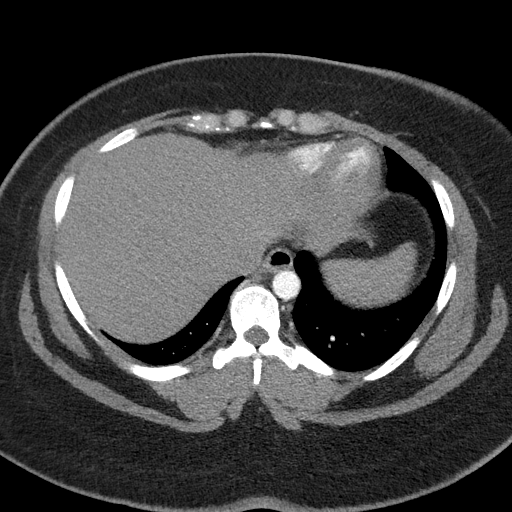
[im 29/82  lung]
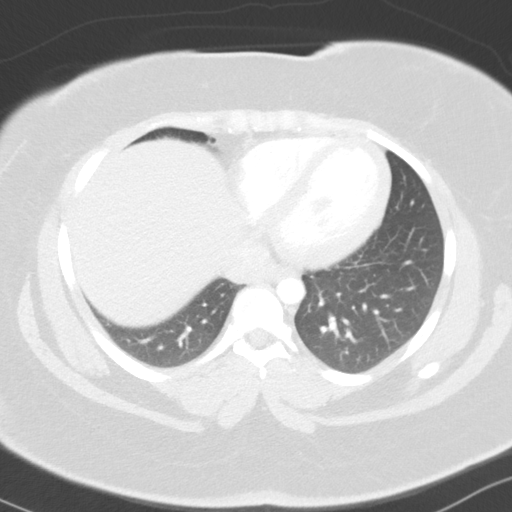
[im 32/82  soft-tissue]
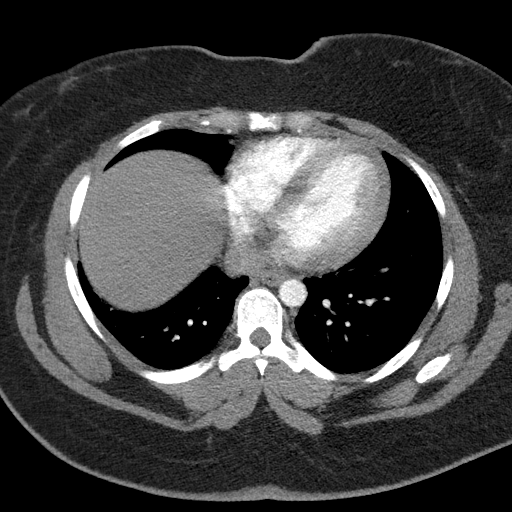
[im 37/82  lung]
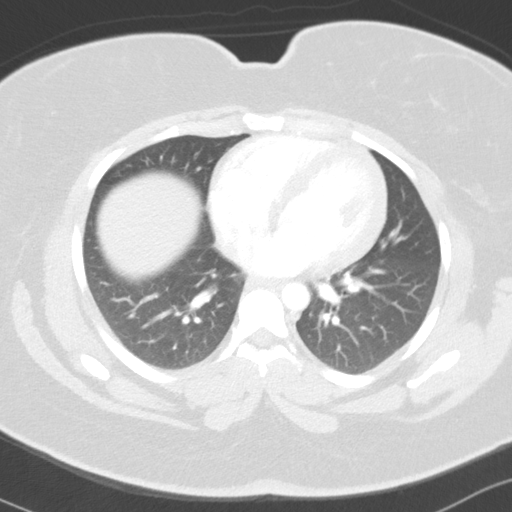
[im 42/82  soft-tissue]
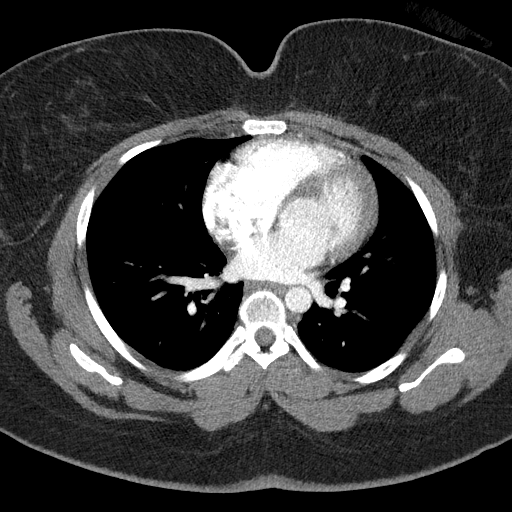
[im 45/82  lung]
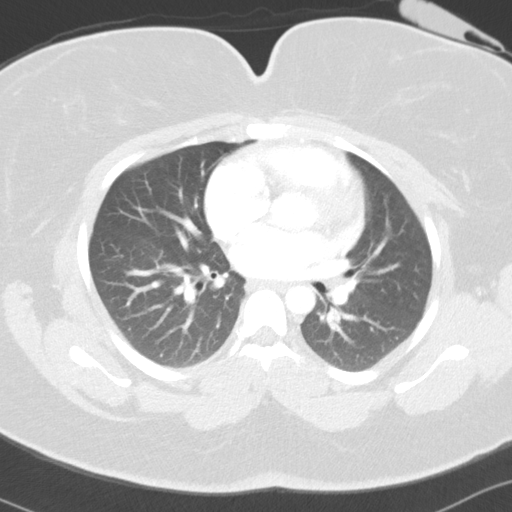
[im 50/82  soft-tissue]
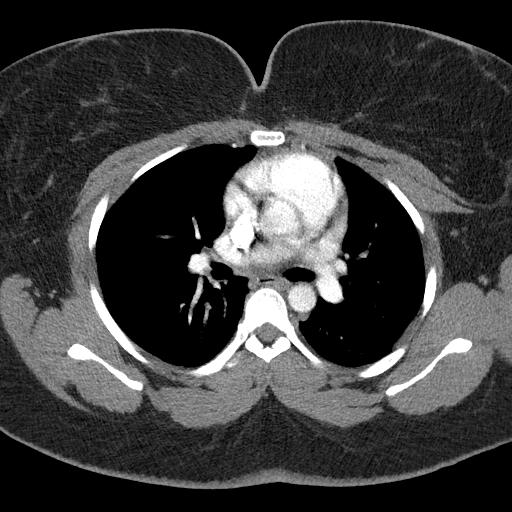
[im 53/82  lung]
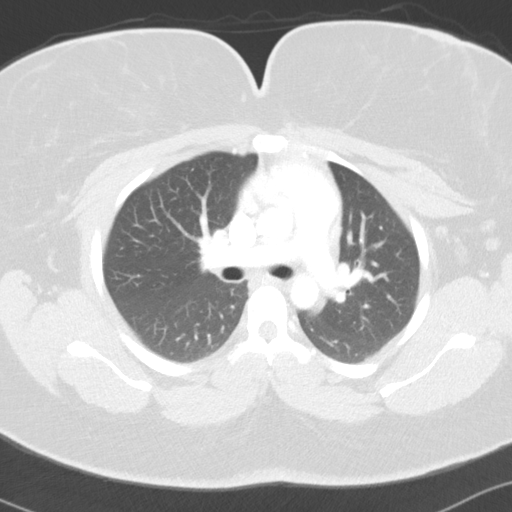
[im 58/82  soft-tissue]
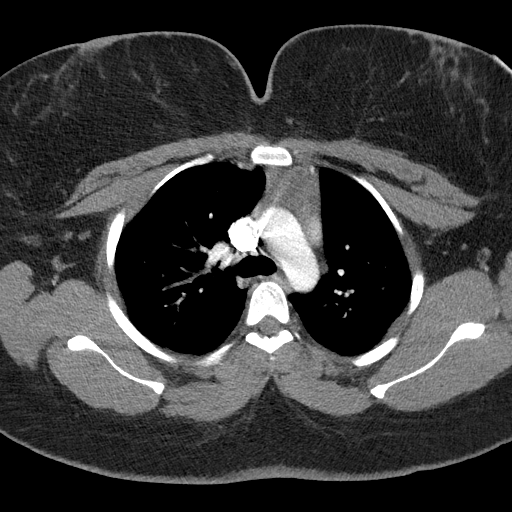
[im 61/82  lung]
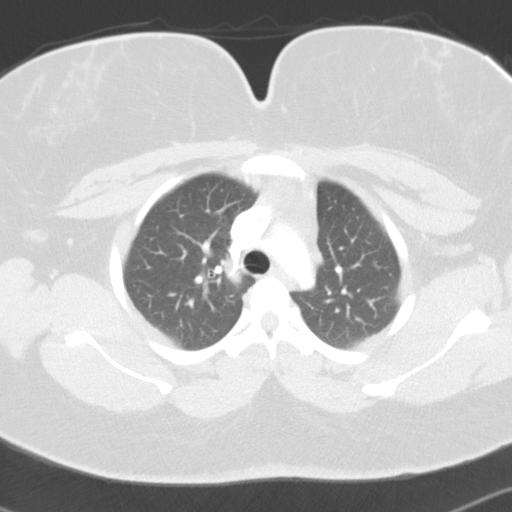
[im 66/82  soft-tissue]
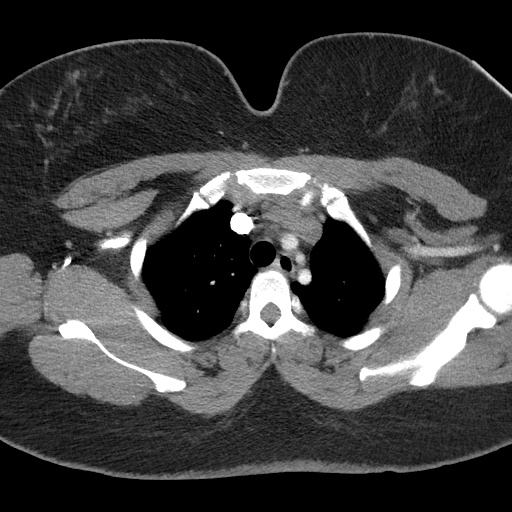
[im 71/82  lung]
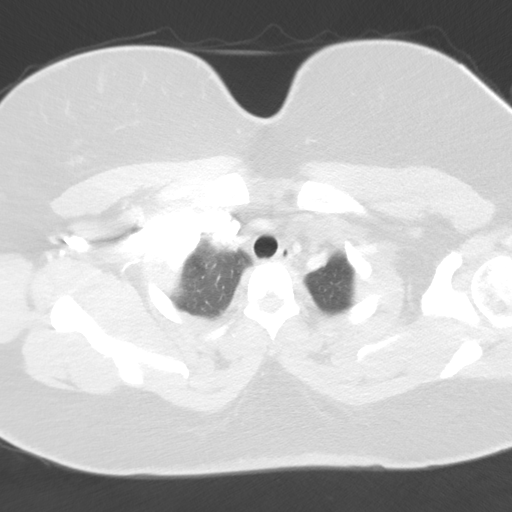
[im 74/82  soft-tissue]
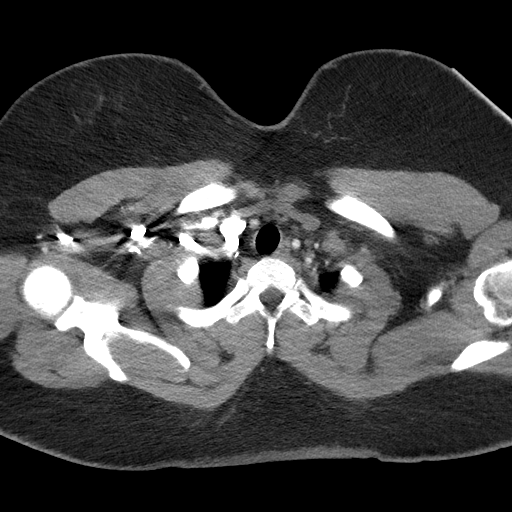
[im 79/82  lung]
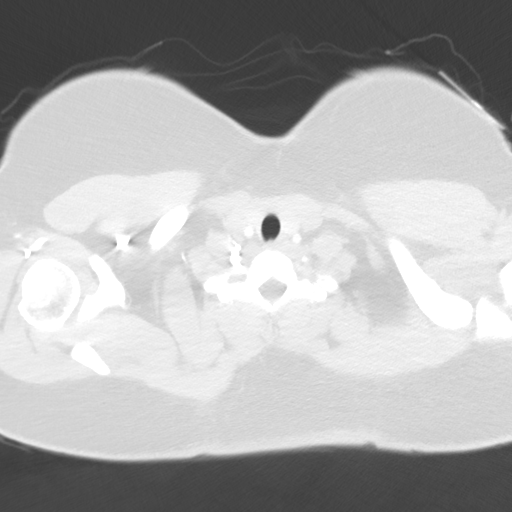

[19 of 36 positions shown; findings below may reference images not displayed]

FINDINGS: Cardiovascular: There is no demonstrable pulmonary embolus. There is
no thoracic aortic aneurysm or dissection. Visualized great vessels
appear unremarkable. There is no pericardial effusion or pericardial
thickening.

Mediastinum/Nodes: Visualized thyroid appears normal. There is
thymic tissue present, normal for age. There is no demonstrable
thoracic adenopathy. No esophageal lesions are evident.

Lungs/Pleura: There is no edema or consolidation. No pleural
effusions are evident.

Upper Abdomen: There is a mass in the right lobe of the liver toward
the dome measuring 3.7 x 3.7 cm. Visualized upper abdominal
structures otherwise appear unremarkable.

Musculoskeletal: No blastic or lytic bone lesions. No evident chest
wall lesions.

Review of the MIP images confirms the above findings.
IMPRESSION: 1. No demonstrable pulmonary embolus. No thoracic aortic aneurysm or
dissection.

2.  Lungs clear.

3.  No evident adenopathy.

4. Mass in the right lobe of the liver toward the dome measuring
x 3.7 cm. Suspect hemangioma. Given the size of this lesion,
nonemergent MR or CT of the liver pre and serial post-contrast
advised to further evaluate.

## 2019-11-25 ENCOUNTER — Ambulatory Visit: Payer: 59 | Admitting: Internal Medicine

## 2020-01-03 ENCOUNTER — Telehealth: Payer: Self-pay | Admitting: Internal Medicine

## 2020-01-03 NOTE — Telephone Encounter (Signed)
I called pt and left vm to call ofc to resch. 

## 2020-02-01 ENCOUNTER — Ambulatory Visit: Payer: 59 | Admitting: Internal Medicine

## 2020-03-10 ENCOUNTER — Other Ambulatory Visit: Payer: Self-pay | Admitting: Internal Medicine

## 2020-03-10 MED ORDER — PHENTERMINE HCL 37.5 MG PO TABS: 37.5000 mg | ORAL_TABLET | Freq: Every day | ORAL | 0 refills | Status: DC

## 2020-03-14 ENCOUNTER — Telehealth: Payer: Self-pay | Admitting: Internal Medicine

## 2020-03-14 NOTE — Telephone Encounter (Signed)
Left message to return call.  Patient is needing to schedule an appointment in order to get refills of the Phentermine 37.5 mg.

## 2020-03-16 ENCOUNTER — Telehealth (INDEPENDENT_AMBULATORY_CARE_PROVIDER_SITE_OTHER): Payer: 59 | Admitting: Internal Medicine

## 2020-03-16 ENCOUNTER — Encounter: Payer: Self-pay | Admitting: Internal Medicine

## 2020-03-16 VITALS — Ht 65.0 in | Wt 280.0 lb

## 2020-03-16 DIAGNOSIS — Z0000 Encounter for general adult medical examination without abnormal findings: Secondary | ICD-10-CM | POA: Diagnosis not present

## 2020-03-16 DIAGNOSIS — Z6841 Body Mass Index (BMI) 40.0 and over, adult: Secondary | ICD-10-CM | POA: Diagnosis not present

## 2020-03-16 DIAGNOSIS — Z13220 Encounter for screening for lipoid disorders: Secondary | ICD-10-CM

## 2020-03-16 DIAGNOSIS — Z1389 Encounter for screening for other disorder: Secondary | ICD-10-CM

## 2020-03-16 DIAGNOSIS — E559 Vitamin D deficiency, unspecified: Secondary | ICD-10-CM

## 2020-03-16 DIAGNOSIS — R519 Headache, unspecified: Secondary | ICD-10-CM | POA: Diagnosis not present

## 2020-03-16 DIAGNOSIS — Z1329 Encounter for screening for other suspected endocrine disorder: Secondary | ICD-10-CM | POA: Diagnosis not present

## 2020-03-16 NOTE — Progress Notes (Signed)
Virtual Visit via Video Note  I connected with [place]  on [id number] at [id number] application and verified that I am speaking with the correct person using two identifiers.  Location patient: work Environmental manager or home office Persons participating in the virtual visit: patient, provider  I discussed the limitations of evaluation and management by telemedicine and the availability of in person appointments. The patient expressed understanding and agreed to proceed.   HPI: 1. Obesity was 287 now 280 lbs on adipex Only did adipex x 30 days   2. C/o h/a and thought 2/2 adipex no h/o h/a prior to adipex and then stopped adipex and still having h/a  Eye exam last in 2019  No h/o migraines, no blurry vision She has had to leave work and bothered by light with h/a but not sound  Getting h/z 2-3 x per week and feels like tension  Reviewed nexplanon had since 07/2019 and no cycles but has 25% chance of h/a  No n/v  No help with tylenol/excedrine/migraines  No FH MS Has no h/o allergies but thinks could have allergies  She does drink water rare coffee but does drink green tea and sleeping 8-12 hrs at night  Denies nasal congestion BP has been ranging 117-120s/70s-80s  H/a 10/10 and had to leave work at times and right sided head and maxillary pressure and like something behind right eye and back of head   ROS: See pertinent positives and negatives per HPI. Denies CP   Past Medical History:  Diagnosis Date  . Abnormal thyroid function test   . Allergy    pollen    Past Surgical History:  Procedure Laterality Date  . BREAST REDUCTION SURGERY Bilateral 2016  . PILONIDAL CYST EXCISION  2010  . WISDOM TOOTH EXTRACTION      Family History  Problem Relation Age of Onset  . Diabetes Father   . Breast cancer Other   . Heart murmur Mother   . GER disease Mother   . Ovarian cancer Neg Hx   . 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  Cholecalciferol 1.25 MG (50000 UT) capsule, Take 1 capsule (50,000 Units total) by mouth once a week., Disp: 13 capsule, Rfl: 1 .  etonogestrel (NEXPLANON) 68 MG IMPL implant, 1 each by Subdermal route once., Disp: , Rfl:  .  phentermine (ADIPEX-P) 37.5 MG tablet, Take 1 tablet (37.5 mg total) by mouth daily before breakfast. appt further refills, Disp: 30 tablet, Rfl: 0 .  ibuprofen (ADVIL) 800 MG tablet, Take 800 mg by mouth as needed., Disp: , Rfl:   EXAM:  VITALS per patient if applicable:  GENERAL: alert, oriented, appears well and in no acute distress  HEENT: atraumatic, conjunttiva clear, no obvious abnormalities on inspection of external nose and ears  NECK: normal movements of the head and neck  LUNGS: on inspection no signs of respiratory distress, breathing rate appears normal, no obvious gross SOB, gasping or wheezing  CV: no obvious cyanosis  MS: moves all visible extremities without noticeable abnormality  PSYCH/NEURO: pleasant and cooperative, no obvious depression or anxiety, speech and thought processing grossly intact  ASSESSMENT AND PLAN:  Discussed the following assessment and plan:  Nonintractable headache, unspecified chronicity pattern, unspecified headache type - Plan: Ambulatory referral to Ophthalmology, CT head  Disc with ob/gyn nexplanon 25% chance of  h/a  Consider neurology in future if h/a contiue  Can try otc allergy pill for now to see if helps if thinks has allergies  No FH MS or aneurysm brain   Morbid obesity with BMI of 45.0-49.9, adult ([place]) -consider adipex 18.75 dose in future instead of 37.5 if causing h/a Cont healthy diet and exercise  Disc saxenda and wt loss surgery also as options   Vitamin D deficiency - Plan: VITAMIN D 25 Hydroxy (Vit-D Deficiency, Fractures)  HM Flu shot utd 08/30/19  Tdap had 08/2018  hpv vaccine pt had per pt  covid  19 vx not had   Pap 05/21/19 negative no hpv reported had BVlmp 06/30/19 right arm nexplanon 07/2019   -we discussed possible serious and likely etiologies, options for evaluation and workup, limitations of telemedicine visit vs in person visit, treatment, treatment risks and precautions. Pt prefers to treat via telemedicine empirically rather then risking or undertaking an in person visit at this moment. Patient agrees to seek prompt in person care if worsening, new symptoms arise, or if is not improving with treatment.   I discussed the assessment and treatment plan with the patient. The patient was provided an opportunity to ask questions and all were answered. The patient agreed with the plan and demonstrated an understanding of the instructions.   The patient was advised to call back or seek an in-person evaluation if the symptoms worsen or if the condition fails to improve as anticipated.  Time spent 30 minutes  Delorise Jackson, MD

## 2020-03-16 NOTE — Patient Instructions (Signed)
General Headache Without Cause A headache is pain or discomfort felt around the head or neck area. The specific cause of a headache may not be found. There are many causes and types of headaches. A few common ones are:  Tension headaches.  Migraine headaches.  Cluster headaches.  Chronic daily headaches. Follow these instructions at home: Watch your condition for any changes. Let your health care provider know about them. Take these steps to help with your condition: Managing pain      Take over-the-counter and prescription medicines only as told by your health care provider.  Lie down in a dark, quiet room when you have a headache.  If directed, put ice on your head and neck area: ? Put ice in a plastic bag. ? Place a towel between your skin and the bag. ? Leave the ice on for 20 minutes, 2-3 times per day.  If directed, apply heat to the affected area. Use the heat source that your health care provider recommends, such as a moist heat pack or a heating pad. ? Place a towel between your skin and the heat source. ? Leave the heat on for 20-30 minutes. ? Remove the heat if your skin turns bright red. This is especially important if you are unable to feel pain, heat, or cold. You may have a greater risk of getting burned.  Keep lights dim if bright lights bother you or make your headaches worse. Eating and drinking  Eat meals on a regular schedule.  If you drink alcohol: ? Limit how much you use to:  0-1 drink a day for women.  0-2 drinks a day for men. ? Be aware of how much alcohol is in your drink. In the [place] one drink equals one 12 oz bottle of beer (355 mL), one 5 oz glass of wine (148 mL), or one 1 oz glass of hard liquor (44 mL).  Stop drinking caffeine, or decrease the amount of caffeine you drink. General instructions   Keep a headache journal to help find out what may trigger your headaches. For example, write down: ? What you eat and drink. ? How much  sleep you get. ? Any change to your diet or medicines.  Try massage or other relaxation techniques.  Limit stress.  Sit up straight, and do not tense your muscles.  Do not use any products that contain nicotine or tobacco, such as cigarettes, e-cigarettes, and chewing tobacco. If you need help quitting, ask your health care provider.  Exercise regularly as told by your health care provider.  Sleep on a regular schedule. Get 7-9 hours of sleep each night, or the amount recommended by your health care provider.  Keep all follow-up visits as told by your health care provider. This is important. Contact a health care provider if:  Your symptoms are not helped by medicine.  You have a headache that is different from the usual headache.  You have nausea or you vomit.  You have a fever. Get help right away if:  Your headache becomes severe quickly.  Your headache gets worse after moderate to intense physical activity.  You have repeated vomiting.  You have a stiff neck.  You have a loss of vision.  You have problems with speech.  You have pain in the eye or ear.  You have muscular weakness or loss of muscle control.  You lose your balance or have trouble walking.  You feel faint or pass out.  You have confusion.    You have a seizure. Summary  A headache is pain or discomfort felt around the head or neck area.  There are many causes and types of headaches. In some cases, the cause may not be found.  Keep a headache journal to help find out what may trigger your headaches. Watch your condition for any changes. Let your health care provider know about them.  Contact a health care provider if you have a headache that is different from the usual headache, or if your symptoms are not helped by medicine.  Get help right away if your headache becomes severe, you vomit, you have a loss of vision, you lose your balance, or you have a seizure. This information is not  intended to replace advice given to you by your health care provider. Make sure you discuss any questions you have with your health care provider. Document Revised: 06/08/2018 Document Reviewed: 06/08/2018 Elsevier Patient Education  2020 Elsevier Inc.  

## 2020-03-20 ENCOUNTER — Ambulatory Visit
Admission: RE | Admit: 2020-03-20 | Discharge: 2020-03-20 | Disposition: A | Discharge status: Home / Self Care | Payer: 59 | Source: Ambulatory Visit | Attending: Internal Medicine | Admitting: Internal Medicine

## 2020-03-20 ENCOUNTER — Other Ambulatory Visit: Payer: Self-pay

## 2020-03-20 DIAGNOSIS — R519 Headache, unspecified: Secondary | ICD-10-CM | POA: Diagnosis not present

## 2020-03-20 NOTE — Telephone Encounter (Signed)
Pt seen 03/16/20

## 2020-03-29 ENCOUNTER — Telehealth: Payer: Self-pay | Admitting: Internal Medicine

## 2020-03-29 ENCOUNTER — Other Ambulatory Visit: Payer: Self-pay | Admitting: Internal Medicine

## 2020-03-29 MED ORDER — PHENTERMINE HCL 37.5 MG PO TABS: 18.2500 mg | ORAL_TABLET | Freq: Every day | ORAL | 0 refills | Status: DC

## 2020-03-29 NOTE — Telephone Encounter (Signed)
Patient has an appointment at [facility] eye with Dr Davina Poke 04/07/20 at 9:15 am.   Patient informed and verbalized understanding.

## 2020-03-30 ENCOUNTER — Ambulatory Visit: Payer: 59

## 2020-05-26 ENCOUNTER — Telehealth: Payer: Self-pay | Admitting: Internal Medicine

## 2020-05-26 NOTE — Telephone Encounter (Signed)
Rejection Reason - Other - Patient cancelled appointment " Margaret Harmon said 4 minutes ago

## 2020-07-28 ENCOUNTER — Other Ambulatory Visit: Payer: Self-pay

## 2020-07-28 DIAGNOSIS — E559 Vitamin D deficiency, unspecified: Secondary | ICD-10-CM

## 2020-07-28 NOTE — Telephone Encounter (Signed)
Needs to discuss with PCP at follow-up or prior to then if the patient feels she needs this refill sooner.

## 2020-07-28 NOTE — Telephone Encounter (Signed)
Pt pharmacy requested a refill of Vitamin D. Last refill was on 09/29/2019. Patient has not been seen in a year and their next appointment is on 09/26/2020.

## 2020-08-02 NOTE — Telephone Encounter (Signed)
Left a message to call back and ask about if she needs a refill on Vitamin D or if it can wait until her appointment on 09/26/20.

## 2020-08-06 ENCOUNTER — Telehealth: Payer: Self-pay | Admitting: Internal Medicine

## 2020-08-06 NOTE — Telephone Encounter (Signed)
Left a message to call back and ask about if she needs a refill on Vitamin D or if it can wait until her appointment on 09/26/20.      Documentation      We will need to check her vitamin D at next appt She can take D3 4000 IU otc until then   Inform pt  Thanks tMS

## 2020-08-08 NOTE — Telephone Encounter (Signed)
Left message to return call. °Mychart message sent as well.  °

## 2020-09-26 ENCOUNTER — Other Ambulatory Visit: Payer: 59

## 2020-10-03 ENCOUNTER — Other Ambulatory Visit: Payer: Self-pay

## 2020-10-03 ENCOUNTER — Telehealth: Payer: 59 | Admitting: Internal Medicine

## 2020-10-03 ENCOUNTER — Telehealth: Payer: Self-pay | Admitting: Internal Medicine

## 2020-10-03 NOTE — Telephone Encounter (Signed)
Patient no-showed today's appointment; appointment was for 11/02 at 4:00 pm, provider notified for review of record. Mychart message sent for patient to re-schedule.

## 2020-10-03 NOTE — Telephone Encounter (Signed)
No answer, no voicemail on home phone.  Left message to return call to start video visit on mobile number.   Called patient at: 4:05pm 4:10pm 4:14pm.

## 2020-10-11 ENCOUNTER — Other Ambulatory Visit: Payer: Self-pay

## 2020-10-11 ENCOUNTER — Encounter: Payer: Self-pay | Admitting: Internal Medicine

## 2020-10-11 ENCOUNTER — Telehealth (INDEPENDENT_AMBULATORY_CARE_PROVIDER_SITE_OTHER): Payer: 59 | Admitting: Internal Medicine

## 2020-10-11 VITALS — Ht 65.0 in | Wt 295.0 lb

## 2020-10-11 DIAGNOSIS — Z13818 Encounter for screening for other digestive system disorders: Secondary | ICD-10-CM | POA: Diagnosis not present

## 2020-10-11 DIAGNOSIS — E611 Iron deficiency: Secondary | ICD-10-CM

## 2020-10-11 DIAGNOSIS — Z0000 Encounter for general adult medical examination without abnormal findings: Secondary | ICD-10-CM

## 2020-10-11 DIAGNOSIS — Z1389 Encounter for screening for other disorder: Secondary | ICD-10-CM | POA: Diagnosis not present

## 2020-10-11 DIAGNOSIS — Z1329 Encounter for screening for other suspected endocrine disorder: Secondary | ICD-10-CM | POA: Diagnosis not present

## 2020-10-11 DIAGNOSIS — E559 Vitamin D deficiency, unspecified: Secondary | ICD-10-CM | POA: Diagnosis not present

## 2020-10-11 DIAGNOSIS — L659 Nonscarring hair loss, unspecified: Secondary | ICD-10-CM | POA: Diagnosis not present

## 2020-10-11 DIAGNOSIS — Z113 Encounter for screening for infections with a predominantly sexual mode of transmission: Secondary | ICD-10-CM

## 2020-10-11 DIAGNOSIS — T7819XA Other adverse food reactions, not elsewhere classified, initial encounter: Secondary | ICD-10-CM

## 2020-10-11 DIAGNOSIS — T781XXA Other adverse food reactions, not elsewhere classified, initial encounter: Secondary | ICD-10-CM

## 2020-10-11 DIAGNOSIS — Z6841 Body Mass Index (BMI) 40.0 and over, adult: Secondary | ICD-10-CM

## 2020-10-11 NOTE — Progress Notes (Signed)
Telephone Note  I connected with Margaret Harmon  on 10/11/20 at  4:30 PM EST by telephone  and verified that I am speaking with the correct person using two identifiers.  Location patient: work Environmental manager or home office Persons participating in the virtual visit: patient, provider  I discussed the limitations of evaluation and management by telemedicine and the availability of in person appointments. The patient expressed understanding and agreed to proceed.   HPI: 1. Obesity at a plateua per pt doing protein fiber and low carb and < 2000 calories daily and walks a lot at work [employer] [address] age 29 and she is 5'5"   She would like to see [facility] wt loss clinic  adipex 37.5 1/2 dose caused increased HR, dry mouth and lightheadedness so she stopped  She is agreeable to try Qysmia instead reviewed DDI with OCP may lower effect use condoms back up   2. 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  Past Medical History:  Diagnosis Date  . Abnormal thyroid function test   . Allergy    pollen    Past Surgical History:  Procedure Laterality Date  . BREAST REDUCTION SURGERY Bilateral 2016  . PILONIDAL CYST EXCISION  2010  . WISDOM TOOTH EXTRACTION       Current Outpatient Medications:  .  Biotin w/ Vitamins C & E (HAIR/SKIN/NAILS PO), Take by mouth., Disp: , Rfl:  .  Cholecalciferol 1.25 MG (50000 UT) capsule, Take 1 capsule (50,000 Units total) by mouth once a week., Disp: 13 capsule, Rfl: 1 .  etonogestrel (NEXPLANON) 68 MG IMPL implant, 1 each by Subdermal route once., Disp: , Rfl:  .  phentermine (ADIPEX-P) 37.5 MG tablet, Take 0.5  tablets (18.75 mg total) by mouth daily before breakfast. appt further refills. No refills after this. Break from med x 3 months (Patient not taking: Reported on 10/11/2020), Disp: 60 tablet, Rfl: 0 .  Phentermine-Topiramate 3.75-23 MG CP24, Take 1 capsule by mouth daily. In am x 14 days, Disp: 14 capsule, Rfl: 0 .  Phentermine-Topiramate 7.5-46 MG CP24, Take 1 capsule by mouth daily. In am. Schedule follow up for further refills, Disp: 90 capsule, Rfl: 0  EXAM:  VITALS per patient if applicable:  PSYCH/NEURO: pleasant and cooperative, no obvious depression or anxiety, speech and thought processing grossly intact  ASSESSMENT AND PLAN:  Discussed the following assessment and plan:  Gastrointestinal food sensitivity - Plan: Ambulatory referral [facility] for testing  Hair loss rec stop chemical processing  Consider dermatology referral in the future   Morbid obesity with BMI of 45.0-49.9, adult ([place]) - Plan: Amb Ref to [facility] Trial of qysmia 3.75-23 x 14 days then 7.5-46 x 90 days Did not tolerate 1/2 dose adipex 37.5  See hpi  HM Flu shot utd 09/2020  Tdap had 08/2018  hpv vaccine pt had per pt  covid 19 2/2 pfizer consider booster when ready   Pap 05/21/19 negative no hpv reported had BVlmp 06/30/19 right arm nexplanon 07/2019  -we discussed possible serious and likely etiologies, options for evaluation and workup, limitations of telemedicine visit vs  in person visit, treatment, treatment risks and precautions.     I discussed the assessment and treatment plan with the patient. The patient was provided an opportunity to ask questions and all were answered. The patient agreed with the plan and demonstrated an understanding of the instructions.    Time spent 30 minutes plan of care referrals, labs and Rx meds and my chart message after visit  Delorise Jackson, MD

## 2020-10-13 ENCOUNTER — Encounter: Payer: Self-pay | Admitting: Internal Medicine

## 2020-10-16 ENCOUNTER — Other Ambulatory Visit: Payer: Self-pay | Admitting: Internal Medicine

## 2020-10-16 ENCOUNTER — Encounter: Payer: Self-pay | Admitting: Internal Medicine

## 2020-10-16 DIAGNOSIS — L659 Nonscarring hair loss, unspecified: Secondary | ICD-10-CM | POA: Insufficient documentation

## 2020-10-16 MED ORDER — PHENTERMINE-TOPIRAMATE ER 7.5-46 MG PO CP24: 1.0000 | ORAL_CAPSULE | Freq: Every day | ORAL | 0 refills | Status: DC

## 2020-10-16 MED ORDER — PHENTERMINE-TOPIRAMATE ER 3.75-23 MG PO CP24: 1.0000 | ORAL_CAPSULE | Freq: Every day | ORAL | 0 refills | Status: DC

## 2020-10-19 ENCOUNTER — Telehealth: Payer: Self-pay

## 2020-10-19 NOTE — Telephone Encounter (Signed)
Lvm to schedule fasting labs asap and then a 4-63m follow up with PCP

## 2020-10-23 ENCOUNTER — Other Ambulatory Visit: Payer: Self-pay

## 2020-10-23 ENCOUNTER — Other Ambulatory Visit (INDEPENDENT_AMBULATORY_CARE_PROVIDER_SITE_OTHER): Payer: 59

## 2020-10-23 DIAGNOSIS — E611 Iron deficiency: Secondary | ICD-10-CM

## 2020-10-23 DIAGNOSIS — Z113 Encounter for screening for infections with a predominantly sexual mode of transmission: Secondary | ICD-10-CM

## 2020-10-23 DIAGNOSIS — Z13818 Encounter for screening for other digestive system disorders: Secondary | ICD-10-CM

## 2020-10-23 DIAGNOSIS — Z0000 Encounter for general adult medical examination without abnormal findings: Secondary | ICD-10-CM | POA: Diagnosis not present

## 2020-10-23 DIAGNOSIS — E559 Vitamin D deficiency, unspecified: Secondary | ICD-10-CM

## 2020-10-23 DIAGNOSIS — Z1389 Encounter for screening for other disorder: Secondary | ICD-10-CM

## 2020-10-23 DIAGNOSIS — Z1329 Encounter for screening for other suspected endocrine disorder: Secondary | ICD-10-CM | POA: Diagnosis not present

## 2020-10-23 LAB — CBC WITH DIFFERENTIAL/PLATELET
Basophils Absolute: 0 10*3/uL (ref 0.0–0.1)
Basophils Relative: 0.7 % (ref 0.0–3.0)
Eosinophils Absolute: 0.2 10*3/uL (ref 0.0–0.7)
Eosinophils Relative: 3.6 % (ref 0.0–5.0)
HCT: 41.5 % (ref 36.0–46.0)
Hemoglobin: 13.9 g/dL (ref 12.0–15.0)
Lymphocytes Relative: 36.5 % (ref 12.0–46.0)
Lymphs Abs: 1.9 10*3/uL (ref 0.7–4.0)
MCHC: 33.5 g/dL (ref 30.0–36.0)
MCV: 82.4 fl (ref 78.0–100.0)
Monocytes Absolute: 0.3 10*3/uL (ref 0.1–1.0)
Monocytes Relative: 5.2 % (ref 3.0–12.0)
Neutro Abs: 2.8 10*3/uL (ref 1.4–7.7)
Neutrophils Relative %: 54 % (ref 43.0–77.0)
Platelets: 303 10*3/uL (ref 150.0–400.0)
RBC: 5.04 Mil/uL (ref 3.87–5.11)
RDW: 13.1 % (ref 11.5–15.5)
WBC: 5.2 10*3/uL (ref 4.0–10.5)

## 2020-10-23 LAB — COMPREHENSIVE METABOLIC PANEL
ALT: 23 U/L (ref 0–35)
AST: 20 U/L (ref 0–37)
Albumin: 3.9 g/dL (ref 3.5–5.2)
Alkaline Phosphatase: 62 U/L (ref 39–117)
BUN: 12 mg/dL (ref 6–23)
CO2: 24 mEq/L (ref 19–32)
Calcium: 9.4 mg/dL (ref 8.4–10.5)
Chloride: 105 mEq/L (ref 96–112)
Creatinine, Ser: 0.78 mg/dL (ref 0.40–1.20)
GFR: 103.16 mL/min (ref >60.00)
Glucose, Bld: 96 mg/dL (ref 70–99)
Potassium: 4 mEq/L (ref 3.5–5.1)
Sodium: 137 mEq/L (ref 135–145)
Total Bilirubin: 0.6 mg/dL (ref 0.2–1.2)
Total Protein: 7.4 g/dL (ref 6.0–8.3)

## 2020-10-23 LAB — LIPID PANEL
Cholesterol: 183 mg/dL (ref 0–200)
HDL: 45.8 mg/dL (ref >39.00)
LDL Cholesterol: 110 mg/dL — ABNORMAL HIGH (ref 0–99)
NonHDL: 137.57
Total CHOL/HDL Ratio: 4
Triglycerides: 139 mg/dL (ref 0.0–149.0)
VLDL: 27.8 mg/dL (ref 0.0–40.0)

## 2020-10-23 LAB — TSH: TSH: 0.84 u[IU]/mL (ref 0.35–4.50)

## 2020-10-23 LAB — T4, FREE: Free T4: 0.86 ng/dL (ref 0.60–1.60)

## 2020-10-23 LAB — VITAMIN D 25 HYDROXY (VIT D DEFICIENCY, FRACTURES): VITD: 26.45 ng/mL — ABNORMAL LOW (ref 30.00–100.00)

## 2020-10-24 ENCOUNTER — Telehealth: Payer: Self-pay | Admitting: Internal Medicine

## 2020-10-24 LAB — URINALYSIS, ROUTINE W REFLEX MICROSCOPIC
Bilirubin Urine: NEGATIVE
Glucose, UA: NEGATIVE
Hgb urine dipstick: NEGATIVE
Ketones, ur: NEGATIVE
Leukocytes,Ua: NEGATIVE
Nitrite: NEGATIVE
Protein, ur: NEGATIVE
Specific Gravity, Urine: 1.013 (ref 1.001–1.03)
pH: 6 (ref 5.0–8.0)

## 2020-10-24 LAB — HEPATITIS C ANTIBODY
Hepatitis C Ab: NONREACTIVE
SIGNAL TO CUT-OFF: 0.02 (ref <1.00)

## 2020-10-24 LAB — IRON,TIBC AND FERRITIN PANEL
%SAT: 27 % (calc) (ref 16–45)
Ferritin: 73 ng/mL (ref 16–154)
Iron: 82 ug/dL (ref 40–190)
TIBC: 299 mcg/dL (calc) (ref 250–450)

## 2020-10-24 LAB — HIV ANTIBODY (ROUTINE TESTING W REFLEX): HIV 1&2 Ab, 4th Generation: NONREACTIVE

## 2020-10-24 NOTE — Telephone Encounter (Signed)
Prior authorization has been submitted for patient's Qsymia 3.75-23MG .  Awaiting approval or denial.

## 2020-10-30 DIAGNOSIS — R110 Nausea: Secondary | ICD-10-CM | POA: Diagnosis not present

## 2020-10-30 DIAGNOSIS — T781XXA Other adverse food reactions, not elsewhere classified, initial encounter: Secondary | ICD-10-CM | POA: Diagnosis not present

## 2020-11-06 NOTE — Telephone Encounter (Signed)
Prior authorization has been approved.  Mychart message sent to inform Patient

## 2020-11-15 ENCOUNTER — Telehealth: Payer: Self-pay | Admitting: Internal Medicine

## 2020-11-15 NOTE — Telephone Encounter (Signed)
lft pt vm regarding referral to [facility].

## 2020-12-12 ENCOUNTER — Telehealth: Payer: Self-pay | Admitting: Internal Medicine

## 2020-12-12 NOTE — Telephone Encounter (Signed)
lft pt vm to follow up on referral to [facility] and lft vm at [facility]

## 2021-01-24 ENCOUNTER — Telehealth: Payer: Self-pay | Admitting: Internal Medicine

## 2021-01-24 NOTE — Telephone Encounter (Signed)
lft vm for pt to call [facility] to follow up on [facility] weight management referral. Thanks

## 2021-02-16 ENCOUNTER — Encounter: Payer: Self-pay | Admitting: Internal Medicine

## 2021-02-16 ENCOUNTER — Telehealth (INDEPENDENT_AMBULATORY_CARE_PROVIDER_SITE_OTHER): Payer: Self-pay | Admitting: Internal Medicine

## 2021-02-16 VITALS — Ht 65.0 in | Wt 293.0 lb

## 2021-02-16 DIAGNOSIS — K9049 Malabsorption due to intolerance, not elsewhere classified: Secondary | ICD-10-CM

## 2021-02-16 DIAGNOSIS — R110 Nausea: Secondary | ICD-10-CM

## 2021-02-16 DIAGNOSIS — J069 Acute upper respiratory infection, unspecified: Secondary | ICD-10-CM

## 2021-02-16 DIAGNOSIS — R21 Rash and other nonspecific skin eruption: Secondary | ICD-10-CM

## 2021-02-16 DIAGNOSIS — J029 Acute pharyngitis, unspecified: Secondary | ICD-10-CM

## 2021-02-16 DIAGNOSIS — E669 Obesity, unspecified: Secondary | ICD-10-CM

## 2021-02-16 NOTE — Patient Instructions (Signed)
Consider covid 19 booster when you are feeling better   There is no medication other than over the counter meds to help you  Mucinex dm green label for cough.  Vitamin C 1000 mg daily.  Vitamin D3 4000 Iu (units) daily.  Zinc 100 mg daily.  Quercetin 250-500 mg 2 times per day   Elderberry  Oil of oregano  cepacol or chloroseptic spray sore throat  Warm salt gargles Warm tea with honey and lemon  Hydration  Try to eat though you dont feel like it   Tylenol or Advil  Nasal saline, Flonase runny nose, congestion  Over the counter antihistamine allegra, claritin, zyrtec xyzal for nasal symptoms, sneezing     Monitor pulse oximeter, buy from [place] if oxygen is less than 90 please go to the hospital.        Are you feeling really sick? Shortness of breath, cough, chest pain?, dizziness? Confusion   If so let me know  If worsening, go to hospital or [facility] clinic Urgent care for further treatment

## 2021-02-16 NOTE — Progress Notes (Addendum)
Virtual Visit via Video Note  I connected with Margaret Harmon  on 02/16/21 at  1:10 PM EDT by a video enabled telemedicine application and verified that I am speaking with the correct person using two identifiers.  Location patient: home, [place] Location provider:work or home office Persons participating in the virtual visit: patient, provider  I discussed the limitations of evaluation and management by telemedicine and the availability of in person appointments. The patient expressed understanding and agreed to proceed.   HPI:  1. Obesity unable to tolerate adipex due to insomnia even low dose qysmia 3.75-23 was not able to tolerate  -[facility] but will re-order referral  At times has nausea so hesistant about wegovy of GLP meds   2. URI sister sick and friends daughter sick she c/o sore throat, ears feel wet, no fever no body aches other than menstrual cramps. She has cough with mucous tried vicks nasal and mucinex otc will test at home for covid  3. GI f/u 10/30/20 [facility] ordered celiac testing and food panel not done  Pt also wants Hpylori testing will [facility] and they wanted to see her in 3 months   -COVID-19 vaccine status: 2/2  ROS: See pertinent positives and negatives per HPI.  Past Medical History:  Diagnosis Date   Abnormal thyroid function test    Allergy    pollen    Past Surgical History:  Procedure Laterality Date   BREAST REDUCTION SURGERY Bilateral 2016   PILONIDAL CYST EXCISION  2010   WISDOM TOOTH EXTRACTION       Current Outpatient Medications:    Biotin w/ Vitamins C & E (HAIR/SKIN/NAILS PO), Take by mouth., Disp: , Rfl:    etonogestrel (NEXPLANON) 68 MG IMPL implant, 1 each by Subdermal route once., Disp: , Rfl:    Multiple Vitamin (MULTIVITAMIN PO), Take by mouth., Disp: , Rfl:    PREBIOTIC PRODUCT PO, Take by mouth., Disp: , Rfl:    Probiotic Product (PROBIOTIC DAILY PO), Take by mouth., Disp: , Rfl:   EXAM:  VITALS per patient if  applicable:  GENERAL: alert, oriented, appears well and in no acute distress  HEENT: atraumatic, conjunttiva clear, no obvious abnormalities on inspection of external nose and ears  NECK: normal movements of the head and neck  LUNGS: on inspection no signs of respiratory distress, breathing rate appears normal, no obvious gross SOB, gasping or wheezing  CV: no obvious cyanosis  MS: moves all visible extremities without noticeable abnormality  PSYCH/NEURO: pleasant and cooperative, no obvious depression or anxiety, speech and thought processing grossly intact  ASSESSMENT AND PLAN:  Discussed the following assessment and plan:  Upper respiratory tract infection, unspecified type Sore throat -rec covid 19 test has home test Consider covid 19 booster when you are feeling better  There is no medication other than over the counter meds to help you  Mucinex dm green label for cough.  Vitamin C 1000 mg daily.  Vitamin D3 4000 Iu (units) daily.  Zinc 100 mg daily.  Quercetin 250-500 mg 2 times per day   Elderberry  Oil of oregano  cepacol or chloroseptic spray sore throat  Warm salt gargles Warm tea with honey and lemon  Hydration  Try to eat though you dont feel like it   Tylenol or Advil  Nasal saline, Flonase runny nose, congestion  Over the counter antihistamine allegra, claritin, zyrtec xyzal for nasal symptoms, sneezing     Monitor pulse oximeter, buy from [facility]  if oxygen is less than 90 please go to the hospital.        Are you feeling really sick? Shortness of breath, cough, chest pain?, dizziness? Confusion   If so let me know  If worsening, go to hospital or [facility] clinic Urgent care for further treatment    Obesity (BMI 30-39.9) [facility]er [facility] again did not get a call will have scheduler call again Unable to do adipex due to insomnia   Nausea Food intolerance [facility] wants h pylori testing did not get blood celiac and food panel      Dermatology referral request 05/15/21 for rash   Have you tried using an antihistamine or anything and is it localized to your arms?  Last read by Margaret Harmon at 3:26 AM on 05/15/2021. May 10, 2021       2:25 PM It looks like something but it could be something i ate Im starting to break out on my other arm too. My left arm is to the point where its puffy when irritated Ive used extra strength cortisone and that hasnt worked.   y"       10:23 AM What kind of out break are you having?  Last read by Margaret Harmon at 3:26 AM on 05/15/2021.     10:18 AM Is it possible if you can refer me to a dermatologist please. Ive had a really bad outbreak thats lasted over a month and cant get in with anyone.  HM Flu shot utd 09/2020  Tdap had 08/2018  hpv vaccine pt had per pt  covid 19 2/2 pfizer consider booster when ready    Pap 05/21/19 negative no hpv reported had BV lmp 06/30/19 right arm nexplanon 07/2019  mammo age 86  Colonoscopy 10   -we discussed possible serious and likely etiologies, options for evaluation and workup, limitations of telemedicine visit vs in person visit, treatment, treatment risks and precautions.    I discussed the assessment and treatment plan with the patient. The patient was provided an opportunity to ask questions and all were answered. The patient agreed with the plan and demonstrated an understanding of the instructions.    Time spent 20 min Delorise Jackson, MD

## 2021-02-20 ENCOUNTER — Ambulatory Visit: Payer: 59 | Admitting: Internal Medicine

## 2021-02-21 ENCOUNTER — Telehealth: Payer: Self-pay | Admitting: Internal Medicine

## 2021-02-21 NOTE — Telephone Encounter (Signed)
lft vm on pt both numbers to call [facility] regarding referral. Thanks

## 2021-03-20 ENCOUNTER — Telehealth: Payer: Self-pay

## 2021-03-20 NOTE — Telephone Encounter (Signed)
Faxed note from Dr Kelly [facility] to [facility] @ [phone] requesting appt for pt on 03/20/21

## 2021-05-10 ENCOUNTER — Encounter: Payer: Self-pay | Admitting: Internal Medicine

## 2021-05-15 NOTE — Telephone Encounter (Signed)
Patient reaching back out to inquire. Okay for referral or does Patient need an appointment?

## 2021-05-15 NOTE — Addendum Note (Signed)
Addended by: Orland Mustard on: 05/15/2021 06:09 PM   Modules accepted: Orders

## 2021-10-19 ENCOUNTER — Ambulatory Visit: Payer: Self-pay | Admitting: Internal Medicine

## 2023-08-18 ENCOUNTER — Telehealth: Payer: Self-pay | Admitting: Family Medicine

## 2023-08-20 ENCOUNTER — Other Ambulatory Visit (HOSPITAL_COMMUNITY)
Admission: RE | Admit: 2023-08-20 | Discharge: 2023-08-20 | Disposition: A | Discharge status: Home / Self Care | Payer: Commercial Managed Care - PPO | Source: Ambulatory Visit | Attending: Family Medicine | Admitting: Family Medicine

## 2023-08-20 ENCOUNTER — Other Ambulatory Visit: Payer: Self-pay

## 2023-08-20 ENCOUNTER — Ambulatory Visit: Payer: Commercial Managed Care - PPO | Admitting: Family Medicine

## 2023-08-20 ENCOUNTER — Ambulatory Visit: Payer: Commercial Managed Care - PPO | Admitting: Obstetrics & Gynecology

## 2023-08-20 VITALS — BP 131/70 | HR 89 | Ht 65.0 in | Wt 328.0 lb

## 2023-08-20 DIAGNOSIS — Z113 Encounter for screening for infections with a predominantly sexual mode of transmission: Secondary | ICD-10-CM | POA: Insufficient documentation

## 2023-08-20 DIAGNOSIS — Z124 Encounter for screening for malignant neoplasm of cervix: Secondary | ICD-10-CM | POA: Diagnosis not present

## 2023-08-20 DIAGNOSIS — Z3046 Encounter for surveillance of implantable subdermal contraceptive: Secondary | ICD-10-CM

## 2023-08-20 DIAGNOSIS — Z01419 Encounter for gynecological examination (general) (routine) without abnormal findings: Secondary | ICD-10-CM

## 2023-08-20 NOTE — Progress Notes (Signed)
GYNECOLOGY ANNUAL PREVENTATIVE CARE ENCOUNTER NOTE  History:     Margaret Harmon is a 32 y.o. G0P0000 female here for a routine annual gynecologic exam.  Current complaints: desires removal of her Nexplanon that has been in place for over 4 years.  Does not want other contraception.   Desires pap and  STI screen. Denies abnormal vaginal bleeding, discharge, pelvic pain, problems with intercourse or other gynecologic concerns.    Gynecologic History No LMP recorded. Patient has had an implant. Last Pap: Several years ago. Result was normal  Obstetric History OB History  Gravida Para Term Preterm AB Living  0 0 0 0 0 0  SAB IAB Ectopic Multiple Live Births  0 0 0 0 0    Past Medical History:  Diagnosis Date   Abnormal thyroid function test    Allergy    pollen    Past Surgical History:  Procedure Laterality Date   BREAST REDUCTION SURGERY Bilateral 2016   PILONIDAL CYST EXCISION  2010   WISDOM TOOTH EXTRACTION      Current Outpatient Medications on File Prior to Visit  Medication Sig Dispense Refill   Biotin w/ Vitamins C & E (HAIR/SKIN/NAILS PO) Take by mouth.     etonogestrel (NEXPLANON) 68 MG IMPL implant 1 each by Subdermal route once.     Multiple Vitamin (MULTIVITAMIN PO) Take by mouth.     PREBIOTIC PRODUCT PO Take by mouth.     Probiotic Product (PROBIOTIC DAILY PO) Take by mouth.     No current facility-administered medications on file prior to visit.    Allergies  Allergen Reactions   Adipex-P [Phentermine]     Insomnia     Social History:  reports that she has never smoked. She has never used smokeless tobacco. She reports current alcohol use. She reports that she does not use drugs.  Family History  Problem Relation Age of Onset   Diabetes Father    Breast cancer Other    Heart murmur Mother    GER disease Mother    Ovarian cancer Neg Hx    Colon cancer Neg Hx     The following portions of the patient's history were reviewed and updated as  appropriate: allergies, current medications, past family history, past medical history, past social history, past surgical history and problem list.  Review of Systems Pertinent items noted in HPI and remainder of comprehensive ROS otherwise negative.  Physical Exam:  BP 131/70   Pulse 89   Ht 5\' 5"  (1.651 m)   Wt (!) 328 lb (148.8 kg)   BMI 54.58 kg/m  CONSTITUTIONAL: Well-developed, well-nourished female in no acute distress.  HENT:  Normocephalic, atraumatic, External right and left ear normal.  EYES: Conjunctivae and EOM are normal. Pupils are equal, round, and reactive to light. No scleral icterus.  NECK: Normal range of motion, supple, no masses.  Normal thyroid.  SKIN: Skin is warm and dry. No rash noted. Not diaphoretic. No erythema. No pallor. MUSCULOSKELETAL: Normal range of motion. No tenderness.  No cyanosis, clubbing, or edema.   NEUROLOGIC: Alert and oriented to person, place, and time. Normal reflexes, muscle tone coordination. No cranial nerve deficit noted. PSYCHIATRIC: Normal Salsgiver and affect. Normal behavior. Normal judgment and thought content. CARDIOVASCULAR: Normal heart rate noted, regular rhythm RESPIRATORY: Clear to auscultation bilaterally. Effort and breath sounds normal, no problems with respiration noted. BREASTS: Symmetric in size. No masses, tenderness, skin changes, nipple drainage, or lymphadenopathy bilaterally.  Performed in the  presence of a chaperone. ABDOMEN: Soft, obese, no distention appreciated.  No tenderness, rebound or guarding.  PELVIC: Normal appearing external genitalia and urethral meatus; normal appearing vaginal mucosa and cervix.  Normal appearing discharge.  Pap smear obtained.  Unable to palpate uterus or adnexa secondary to habitus.  Performed in the presence of a chaperone.  Nexplanon Removal Patient identified, informed consent performed, consent signed.   Appropriate time out taken. Nexplanon site identified I'm her right arm.  Area  prepped in usual sterile fashon. One ml of 1% lidocaine was used to anesthetize the area at the distal end of the implant. A small stab incision was made right beside the implant on the distal portion.  The Nexplanon rod was grasped using hemostats and removed without difficulty.  There was minimal blood loss. There were no complications.  3 ml of 1% lidocaine was injected around the incision for post-procedure analgesia.  Steri-strips were applied over the small incision.  A pressure bandage was applied to reduce any bruising.  The patient tolerated the procedure well and was given post procedure instructions.    Assessment and Plan:    1. Encounter for Nexplanon removal [Z30.46] Removed successfully.  She does not want any contraception for now.  2. Screening for STD (sexually transmitted disease) STI screen done, will follow up results and manage accordingly. - Cytology - PAP( [place]) - RPR+HBsAg+HCVAb+HIV  3. Well woman exam with routine gynecological exam - Cytology - PAP( [employer]) Will follow up results of pap smear and manage accordingly. Normal breast examination today, she was advised to perform periodic self breast examinations.  Routine preventative health maintenance measures emphasized. Please refer to After Visit Summary for other counseling recommendations.      Jaynie Collins, MD, FACOG Obstetrician & Gynecologist, [facility] for [employer], [facility]

## 2023-08-21 LAB — RPR+HBSAG+HCVAB+...
HIV Screen 4th Generation wRfx: NONREACTIVE
Hep C Virus Ab: NONREACTIVE
Hepatitis B Surface Ag: NEGATIVE
RPR Ser Ql: NONREACTIVE

## 2023-08-26 LAB — CYTOLOGY - PAP
Chlamydia: NEGATIVE
Comment: NEGATIVE
Comment: NEGATIVE
Comment: NEGATIVE
Comment: NORMAL
Diagnosis: NEGATIVE
High risk HPV: NEGATIVE
Neisseria Gonorrhea: NEGATIVE
Trichomonas: NEGATIVE

## 2023-10-16 ENCOUNTER — Ambulatory Visit (INDEPENDENT_AMBULATORY_CARE_PROVIDER_SITE_OTHER): Payer: Commercial Managed Care - PPO | Admitting: Family Medicine

## 2023-10-16 ENCOUNTER — Encounter: Payer: Self-pay | Admitting: Family Medicine

## 2023-10-16 VITALS — BP 122/79 | HR 79 | Resp 16 | Ht 65.0 in | Wt 331.0 lb

## 2023-10-16 DIAGNOSIS — K769 Liver disease, unspecified: Secondary | ICD-10-CM | POA: Insufficient documentation

## 2023-10-16 DIAGNOSIS — E559 Vitamin D deficiency, unspecified: Secondary | ICD-10-CM | POA: Diagnosis not present

## 2023-10-16 DIAGNOSIS — G44209 Tension-type headache, unspecified, not intractable: Secondary | ICD-10-CM

## 2023-10-16 DIAGNOSIS — R55 Syncope and collapse: Secondary | ICD-10-CM | POA: Diagnosis not present

## 2023-10-16 DIAGNOSIS — Z7689 Persons encountering health services in other specified circumstances: Secondary | ICD-10-CM

## 2023-10-16 DIAGNOSIS — R002 Palpitations: Secondary | ICD-10-CM | POA: Diagnosis not present

## 2023-10-16 DIAGNOSIS — R946 Abnormal results of thyroid function studies: Secondary | ICD-10-CM

## 2023-10-16 DIAGNOSIS — R42 Dizziness and giddiness: Secondary | ICD-10-CM | POA: Diagnosis not present

## 2023-10-16 DIAGNOSIS — M79661 Pain in right lower leg: Secondary | ICD-10-CM | POA: Insufficient documentation

## 2023-10-16 MED ORDER — KETOROLAC TROMETHAMINE 30 MG/ML IJ SOLN
30.0000 mg | Freq: Once | INTRAMUSCULAR | Status: AC
Start: 2023-10-16 — End: 2023-10-16
  Administered 2023-10-16: 30 mg via INTRAMUSCULAR

## 2023-10-16 NOTE — Assessment & Plan Note (Signed)
Chronic  Vitamin d levels ordered today

## 2023-10-16 NOTE — Assessment & Plan Note (Signed)
Chronic HA  Increasing symptoms  CMP, TSH, T4, CBC, A1c ordered  Toradol 30mg  dose administered in clinic today

## 2023-10-16 NOTE — Assessment & Plan Note (Signed)
Recent onset of right calf pain with a history of travel. No signs of deep vein thrombosis (DVT) on physical exam, but further investigation is warranted to rule out DVT. - Ordered ultrasound of the right calf, hx of elevated d-dimer and BMI 55

## 2023-10-16 NOTE — Assessment & Plan Note (Signed)
A1c  CMP CBC TSH/T4/T3 ordered today

## 2023-10-16 NOTE — Assessment & Plan Note (Signed)
Follow up thyroid studies ordered today  Normal after check in 2020 was abnormal

## 2023-10-16 NOTE — Assessment & Plan Note (Addendum)
  Liver mass identified as a hemangioma on a CT scan in November 2020. No follow-up imaging since then. No current symptoms related to the liver mass. Discussed the need for follow-up imaging to monitor the hemangioma. MRI liver for follow up of lesion

## 2023-10-16 NOTE — Assessment & Plan Note (Signed)
Intermittent episodes of lightheadedness, nausea, and near syncope for the past two years. Symptoms include tachycardia and occasional bradycardia (40s-50s). No clear association with meals. Blood pressure remains stable during episodes. Differential diagnosis includes vasovagal syncope, orthostatic hypotension, and cardiac arrhythmias. Discussed the importance of hydration and monitoring for new symptoms. Chronic problem, worsening symptoms frequency  - Order CBC, CMP, A1c, thyroid function tests, and liver function tests - Administer Toradol injection for headache

## 2023-10-16 NOTE — Progress Notes (Signed)
New patient visit   Patient: Margaret Harmon   DOB: October 23, 1991   32 y.o. Female  MRN: [id number] Visit Date: 10/16/2023  Today's healthcare provider: Ronnald Ramp, MD   Chief Complaint  Patient presents with   Establish Care   Subjective    Margaret Harmon is a 32 y.o. female who presents today as a new patient to establish care.      Discussed the use of AI scribe software for clinical note transcription with the patient, who gave verbal consent to proceed.  History of Present Illness   The patient is a 32 year old female with a past medical history of abnormal thyroid function tests, anemia, and cosmetic surgery, including breast reduction. She has a known allergy to pollen and phentermine. She is currently using Nexplanon for contraception and taking biotin supplements, as well as pre and probiotics for general health. The patient has a family history of diabetes in her father and a heart murmur in her mother. She denies any current use of birth control, drug use, and tobacco use, but does admit to occasional alcohol consumption.  The patient has been experiencing episodes of lightheadedness, nausea, and a sensation of near syncope for the past two years. These episodes are accompanied by a sensation of a racing heart rate and a tension-type headache. The patient has noted that during these episodes, her heart rate has been in the 40s to 50s. She denies any association with meals but does note that the headache is always accompanied by a feeling of nausea.  The patient also reports a recent onset of right calf pain. She denies any recent long trips or periods of prolonged sitting. She has a history of edema and has taken medication for it in the past.  The patient has a surgical history of wisdom tooth extraction, removal of a polyadenyl cyst in 2010, and breast reduction surgery in 2016. She also reports a history of spots on her liver, which were identified in [place] in 2016-2018. The patient's last labs from 2021 showed a normal CBC and CMP, slightly elevated LDL at 110, a T4 of 15 with a normal TSH of 0.84, and mildly low vitamin D at 26.45. Blood pressures have ranged from 111 to 131 systolic and 67 to 70 diastolic since 2020.  The patient is currently not on any medication for obesity, although she has previously been treated with semaglutide injections. She recently had her Nexplanon removed after it reached its three-year lifespan and does not plan to replace it. She denies any interest in becoming pregnant at this time.      Past Medical History:  Diagnosis Date   Abnormal thyroid function test    Allergy    pollen   Anemia     Outpatient Medications Prior to Visit  Medication Sig   Biotin w/ Vitamins C & E (HAIR/SKIN/NAILS PO) Take by mouth.   Multiple Vitamin (MULTIVITAMIN PO) Take by mouth.   PREBIOTIC PRODUCT PO Take by mouth.   Probiotic Product (PROBIOTIC DAILY PO) Take by mouth.   [DISCONTINUED] etonogestrel (NEXPLANON) 68 MG IMPL implant 1 each by Subdermal route once.   No facility-administered medications prior to visit.    Past Surgical History:  Procedure Laterality Date   BREAST REDUCTION SURGERY Bilateral 2016   BREAST SURGERY     COSMETIC SURGERY     PILONIDAL CYST EXCISION  2010   WISDOM TOOTH EXTRACTION     Family Status  Relation Name Status  Mother  Alive   Father Janit Pagan   Sister  Alive   Brother  Alive   PGM  (Not Specified)   PGF  (Not Specified)   Other MG Aunt Deceased   Neg Hx  (Not Specified)  No partnership data on file   Family History  Problem Relation Age of Onset   Heart murmur Mother    GER disease Mother    Diabetes Father    Breast cancer Paternal Grandmother    Diabetes Paternal Grandfather    Breast cancer Other    Ovarian cancer Neg Hx    Colon cancer Neg Hx    Social History   Socioeconomic History   Marital status: Single    Spouse name: Not on file   Number  of children: Not on file   Years of education: Not on file   Highest education level: Associate degree: occupational, Scientist, product/process development, or vocational program  Occupational History   Not on file  Tobacco Use   Smoking status: Never   Smokeless tobacco: Never  Vaping Use   Vaping status: Never Used  Substance and Sexual Activity   Alcohol use: Yes    Comment: occasional    Drug use: Never   Sexual activity: Not Currently    Birth control/protection: Abstinence, None  Other Topics Concern   Not on file  Social History Narrative   2 siblings brother and sister    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 [facility] [employer] [facility] L&D    From [place]   Left handed    Social Determinants of Health   Financial Resource Strain: Medium Risk (10/12/2023)   Overall Financial Resource Strain (CARDIA)    Difficulty of Paying Living Expenses: Somewhat hard  Food Insecurity: No Food Insecurity (10/12/2023)   Hunger Vital Sign    Worried About Running Out of Food in the Last Year: Never true    Ran Out of Food in the Last Year: Never true  Transportation Needs: No Transportation Needs (10/12/2023)   PRAPARE - Administrator, Civil Service (Medical): No    Lack of Transportation (Non-Medical): No  Physical Activity: Insufficiently Active (10/12/2023)   Exercise Vital Sign    Days of Exercise per Week: 2 days    Minutes of Exercise per Session: 30 min  Stress: No Stress Concern Present (10/12/2023)   [employer] of Occupational Health - Occupational Stress Questionnaire    Feeling of Stress : Only a little  Social Connections: Socially Isolated (10/12/2023)   Social Connection and Isolation Panel [NHANES]    Frequency of Communication with Friends and Family: Once a week    Frequency of Social Gatherings with Friends and Family: Once a week    Attends Religious Services: Never    Database administrator or Organizations: No    Attends Banker Meetings: Not on file     Marital Status: Never married     No Known Allergies   Immunization History  Administered Date(s) Administered   Hepatitis B 11/10/2017, 12/11/2017, 07/27/2018   Hepatitis B, PED/ADOLESCENT 04/14/1992, 06/20/1992, 05/29/1993   Hepb-cpg 07/28/2020, 08/31/2020   Influenza, Seasonal, Injecte, Preservative Fre 10/24/2014   Influenza-Unspecified 07/04/2019, 09/22/2020   MMR 05/15/1993, 02/20/1996   PFIZER Comirnaty(Gray Top)Covid-19 Tri-Sucrose Vaccine 07/05/2021   PFIZER(Purple Top)SARS-COV-2 Vaccination 06/23/2020, 07/14/2020   Tdap 08/28/2018    Health Maintenance  Topic Date Due   COVID-19 Vaccine (4 - 2023-24 season) 08/03/2023   Cervical Cancer Screening (HPV/Pap  Cotest)  08/19/2028   DTaP/Tdap/Td (2 - Td or Tdap) 08/28/2028   INFLUENZA VACCINE  Completed   Hepatitis C Screening  Completed   HIV Screening  Completed   HPV VACCINES  Aged Out    Patient Care Team: Ronnald Ramp, MD as PCP - General (Family Medicine)  Review of Systems  Last CBC Lab Results  Component Value Date   WBC 5.2 10/23/2020   HGB 13.9 10/23/2020   HCT 41.5 10/23/2020   MCV 82.4 10/23/2020   MCH 26.9 05/21/2019   RDW 13.1 10/23/2020   PLT 303.0 10/23/2020   Last metabolic panel Lab Results  Component Value Date   GLUCOSE 96 10/23/2020   NA 137 10/23/2020   K 4.0 10/23/2020   CL 105 10/23/2020   CO2 24 10/23/2020   BUN 12 10/23/2020   CREATININE 0.78 10/23/2020   GFR 103.16 10/23/2020   CALCIUM 9.4 10/23/2020   PROT 7.4 10/23/2020   ALBUMIN 3.9 10/23/2020   BILITOT 0.6 10/23/2020   ALKPHOS 62 10/23/2020   AST 20 10/23/2020   ALT 23 10/23/2020   Last lipids Lab Results  Component Value Date   CHOL 183 10/23/2020   HDL 45.80 10/23/2020   LDLCALC 110 (H) 10/23/2020   TRIG 139.0 10/23/2020   CHOLHDL 4 10/23/2020   Last hemoglobin A1c Lab Results  Component Value Date   HGBA1C 5.5 09/27/2019   Last thyroid functions Lab Results  Component Value Date   TSH  0.84 10/23/2020   T3TOTAL 135 06/23/2019   T4TOTAL 15.1 (H) 05/21/2019   Last vitamin D Lab Results  Component Value Date   VD25OH 26.45 (L) 10/23/2020   Last vitamin B12 and Folate No results found for: "VITAMINB12", "FOLATE"      Objective    BP 122/79   Pulse 79   Resp 16   Ht 5\' 5"  (1.651 m)   Wt (!) 331 lb (150.1 kg)   SpO2 100%   BMI 55.08 kg/m  BP Readings from Last 3 Encounters:  10/16/23 122/79  08/20/23 131/70  09/21/19 134/78   Wt Readings from Last 3 Encounters:  10/16/23 (!) 331 lb (150.1 kg)  08/20/23 (!) 328 lb (148.8 kg)  02/16/21 293 lb (132.9 kg)        Depression Screen    08/20/2023    3:53 PM 03/16/2020   11:26 AM 09/21/2019    2:11 PM 06/15/2019    2:03 PM  PHQ 2/9 Scores  PHQ - 2 Score 0 0 0 0  PHQ- 9 Score 0      No results found for any visits on 10/16/23.  Results   LABS LDL: 110 (2021) T4: 15 (2020) TSH: 0.84 (2020) Vitamin D: 26.45 (2021) D-dimer: 0.57 (2020)  RADIOLOGY Liver CT: 3.7 x 3.7 cm hemangioma in right lobe (10/2019) Chest CT: lungs clear (10/2019)  DIAGNOSTIC Lower extremity ultrasound: no DVT (2020)       Physical Exam Physical Exam   VITALS: BP- 111/67 to 131/70 CARDIOVASCULAR: Heart sounds regular, no murmurs. CHEST: CTAB without wheezing or rales, no crackles  ABDOMEN: No tenderness. soft, normal BS, no masses palpated, no hepatomegaly appreciated  EXTREMITIES: non pitting trace edema bilaterally       Assessment & Plan      Problem List Items Addressed This Visit       Cardiovascular and Mediastinum   Postural dizziness with presyncope - Primary    Intermittent episodes of lightheadedness, nausea, and near syncope for the past two  years. Symptoms include tachycardia and occasional bradycardia (40s-50s). No clear association with meals. Blood pressure remains stable during episodes. Differential diagnosis includes vasovagal syncope, orthostatic hypotension, and cardiac arrhythmias. Discussed  the importance of hydration and monitoring for new symptoms. Chronic problem, worsening symptoms frequency  - Order CBC, CMP, A1c, thyroid function tests, and liver function tests - Administer Toradol injection for headache      Relevant Orders   Comprehensive metabolic panel   CBC   Hemoglobin A1c   TSH+T4F+T3Free     Digestive   Hepatic lesion     Liver mass identified as a hemangioma on a CT scan in November 2020. No follow-up imaging since then. No current symptoms related to the liver mass. Discussed the need for follow-up imaging to monitor the hemangioma. MRI liver for follow up of lesion       Relevant Orders   MR LIVER W WO CONTRAST     Other   Vitamin D deficiency    Chronic  Vitamin d levels ordered today       Relevant Orders   Vitamin D (25 hydroxy)   Tension headache    Chronic HA  Increasing symptoms  CMP, TSH, T4, CBC, A1c ordered  Toradol 30mg  dose administered in clinic today       Relevant Orders   Comprehensive metabolic panel   Right calf pain    Recent onset of right calf pain with a history of travel. No signs of deep vein thrombosis (DVT) on physical exam, but further investigation is warranted to rule out DVT. - Ordered ultrasound of the right calf, hx of elevated d-dimer and BMI 55       Relevant Orders   US Venous Img Lower Unilateral Right (DVT)   RESOLVED: Palpitations   Obesity, morbid, BMI 40.0-49.9 (HCC)    A1c  CMP CBC TSH/T4/T3 ordered today        Relevant Orders   Comprehensive metabolic panel   Abnormal thyroid function test    Follow up thyroid studies ordered today  Normal after check in 2020 was abnormal       Other Visit Diagnoses     Establishing care with new doctor, encounter for             Return in about 3 weeks (around 11/06/2023) for calf pain, presyncope.      Ronnald Ramp, MD  [facility] [phone] (phone) [phone] (fax)  [facility]

## 2023-10-17 LAB — CBC
Hematocrit: 43.5 % (ref 34.0–46.6)
Hemoglobin: 14.2 g/dL (ref 11.1–15.9)
MCH: 28 pg (ref 26.6–33.0)
MCHC: 32.6 g/dL (ref 31.5–35.7)
MCV: 86 fL (ref 79–97)
Platelets: 355 10*3/uL (ref 150–450)
RBC: 5.07 x10E6/uL (ref 3.77–5.28)
RDW: 13.1 % (ref 11.7–15.4)
WBC: 7.1 10*3/uL (ref 3.4–10.8)

## 2023-10-17 LAB — COMPREHENSIVE METABOLIC PANEL
ALT: 19 [IU]/L (ref 0–32)
AST: 21 [IU]/L (ref 0–40)
Albumin: 4.1 g/dL (ref 3.9–4.9)
Alkaline Phosphatase: 70 [IU]/L (ref 44–121)
BUN/Creatinine Ratio: 15 (ref 9–23)
BUN: 12 mg/dL (ref 6–20)
Bilirubin Total: 0.2 mg/dL (ref 0.0–1.2)
CO2: 25 mmol/L (ref 20–29)
Calcium: 9.7 mg/dL (ref 8.7–10.2)
Chloride: 103 mmol/L (ref 96–106)
Creatinine, Ser: 0.81 mg/dL (ref 0.57–1.00)
Globulin, Total: 3.3 g/dL (ref 1.5–4.5)
Glucose: 80 mg/dL (ref 70–99)
Potassium: 4.2 mmol/L (ref 3.5–5.2)
Sodium: 140 mmol/L (ref 134–144)
Total Protein: 7.4 g/dL (ref 6.0–8.5)
eGFR: 99 mL/min/{1.73_m2} (ref >59)

## 2023-10-17 LAB — VITAMIN D 25 HYDROXY (VIT D DEFICIENCY, FRACTURES): Vit D, 25-Hydroxy: 32.2 ng/mL (ref 30.0–100.0)

## 2023-10-17 LAB — TSH+T4F+T3FREE
Free T4: 1.14 ng/dL (ref 0.82–1.77)
T3, Free: 3.4 pg/mL (ref 2.0–4.4)
TSH: 0.722 u[IU]/mL (ref 0.450–4.500)

## 2023-10-17 LAB — HEMOGLOBIN A1C
Est. average glucose Bld gHb Est-mCnc: 120 mg/dL
Hgb A1c MFr Bld: 5.8 % — ABNORMAL HIGH (ref 4.8–5.6)

## 2023-10-20 ENCOUNTER — Ambulatory Visit (HOSPITAL_COMMUNITY)
Admission: RE | Admit: 2023-10-20 | Discharge: 2023-10-20 | Disposition: A | Discharge status: Home / Self Care | Payer: Commercial Managed Care - PPO | Source: Ambulatory Visit | Attending: Family Medicine | Admitting: Family Medicine

## 2023-10-20 DIAGNOSIS — K769 Liver disease, unspecified: Secondary | ICD-10-CM | POA: Insufficient documentation

## 2023-10-20 DIAGNOSIS — D1803 Hemangioma of intra-abdominal structures: Secondary | ICD-10-CM | POA: Diagnosis not present

## 2023-10-20 MED ORDER — GADOBUTROL 1 MMOL/ML IV SOLN
10.0000 mL | Freq: Once | INTRAVENOUS | Status: AC | PRN
  Administered 2023-10-20: 10 mL via INTRAVENOUS

## 2023-10-24 ENCOUNTER — Ambulatory Visit: Admission: RE | Admit: 2023-10-24 | Payer: Commercial Managed Care - PPO | Source: Ambulatory Visit

## 2023-10-28 ENCOUNTER — Telehealth: Payer: Self-pay | Admitting: Family Medicine

## 2023-10-28 ENCOUNTER — Telehealth: Payer: Self-pay

## 2023-10-28 ENCOUNTER — Ambulatory Visit (HOSPITAL_COMMUNITY)
Admission: RE | Admit: 2023-10-28 | Discharge: 2023-10-28 | Disposition: A | Discharge status: Home / Self Care | Payer: Commercial Managed Care - PPO | Source: Ambulatory Visit | Attending: Family Medicine | Admitting: Family Medicine

## 2023-10-28 DIAGNOSIS — M79661 Pain in right lower leg: Secondary | ICD-10-CM | POA: Insufficient documentation

## 2023-10-28 NOTE — Telephone Encounter (Signed)
Pt is calling in because she has an appointment scheduled at [facility] and she works at [employer]. Pt says [facility] can do the appointment for her today at 3 but they would need Dr. Roxan Hockey to send the orders over. Please follow up with pt.

## 2023-10-28 NOTE — Telephone Encounter (Signed)
Wallace Cullens with [facility] [place] calling to report pt's VAS [place] LOWER EXTREMITY VENOUS was negative for DVT. Report will be sent soon.

## 2023-10-28 NOTE — Telephone Encounter (Signed)
Left message for patient to return call. I see that the appointment is already scheduled this afternoon at 3:00 pm.

## 2023-10-28 NOTE — Telephone Encounter (Signed)
Patient has arrived for the appointment she scheduled at 3:00 today. We still have not heard from her. We will close this encounter since it appears she does not need anything further from this office.

## 2023-10-28 NOTE — Progress Notes (Signed)
Right lower extremity venous duplex has been completed. Preliminary results can be found in CV Proc through chart review.  Results were given to [place] at Dr. Mikey Bussing office.  10/28/23 3:27 PM Olen Cordial RVT

## 2023-10-28 NOTE — Telephone Encounter (Signed)
Pt had the MRI, is this for the [place] of her leg? What order is needed?

## 2023-10-29 ENCOUNTER — Ambulatory Visit: Payer: Commercial Managed Care - PPO

## 2023-10-29 NOTE — Telephone Encounter (Signed)
[place] results did not show DVT  Please notify patient   Ronnald Ramp, MD

## 2023-10-29 NOTE — Telephone Encounter (Signed)
Advised. Verbalized understanding

## 2023-11-06 ENCOUNTER — Encounter: Payer: Self-pay | Admitting: Family Medicine

## 2023-11-06 ENCOUNTER — Ambulatory Visit: Payer: Commercial Managed Care - PPO | Admitting: Family Medicine

## 2023-11-06 VITALS — BP 134/83 | HR 83 | Resp 16 | Ht 65.0 in | Wt 327.0 lb

## 2023-11-06 DIAGNOSIS — G4452 New daily persistent headache (NDPH): Secondary | ICD-10-CM

## 2023-11-06 DIAGNOSIS — R55 Syncope and collapse: Secondary | ICD-10-CM

## 2023-11-06 DIAGNOSIS — M79661 Pain in right lower leg: Secondary | ICD-10-CM

## 2023-11-06 DIAGNOSIS — R42 Dizziness and giddiness: Secondary | ICD-10-CM

## 2023-11-06 MED ORDER — SUMATRIPTAN SUCCINATE 25 MG PO TABS
25.0000 mg | ORAL_TABLET | ORAL | 0 refills | Status: AC | PRN
Start: 2023-11-06 — End: ?

## 2023-11-06 NOTE — Patient Instructions (Signed)
VISIT SUMMARY:  During today's visit, we discussed your ongoing right calf pain and new daily headaches. We reviewed the results of your recent liver MRI, which showed a benign hemangioma that does not require further follow-up. We also discussed your symptoms and potential treatment options for your headaches and calf pain.  YOUR PLAN:  -NEW DAILY PERSISTENT HEADACHE: You have been experiencing daily headaches that feel like a band around your head, sometimes more on the left side, along with nausea, lightheadedness, and heat flashes. We will start you on Imitrex 25 mg to see if it helps with your headaches. Take one dose and you may repeat it in 2 hours if needed, but do not take more than two doses in 48 hours. We will also get an MRI of your brain without contrast to further investigate the cause of your headaches. Please follow up in 4 weeks to assess how you are responding to the treatment and to review the MRI results.  -RIGHT CALF PAIN: Your right calf pain, which worsens with prolonged standing and improves with rest, may be due to a musculoskeletal issue like microtears or strain. We will start by getting an x-ray of your right lower leg. In the meantime, you can use Voltaren gel topically to help with the pain. If the x-ray is normal, we may refer you to an orthopedic specialist for further evaluation.  -BENIGN LIVER HEMANGIOMA: Your liver MRI showed a benign hemangioma, which is a non-cancerous growth in the liver. No further follow-up is needed for this issue.  INSTRUCTIONS:  Please follow up in 4 weeks to assess your response to the headache treatment and to review the MRI results. Additionally, we will review the x-ray results of your right lower leg at that time.

## 2023-11-06 NOTE — Progress Notes (Signed)
Established patient visit   Patient: Margaret Harmon   DOB: 04-14-1991   32 y.o. Female  MRN: [id number] Visit Date: 11/06/2023  Today's healthcare provider: Ronnald Ramp, MD   Chief Complaint  Patient presents with   Leg Pain    Right calf x a few weeks   Dizziness    For years   Subjective     HPI     Leg Pain    Additional comments: Right calf x a few weeks        Dizziness    Additional comments: For years      Last edited by Ashok Cordia, CMA on 11/06/2023  9:35 AM.       Discussed the use of AI scribe software for clinical note transcription with the patient, who gave verbal consent to proceed.  History of Present Illness   The patient, a 32 year old female, presents for follow-up of persistent right calf pain and new onset headaches. The calf pain has been evaluated with a negative DVT ultrasound. The patient also had a liver MRI for abnormal findings on previous imaging, which revealed a benign hemangioma with no recommendation for further follow-up. 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 The pain is localized to the calf and improves with rest.  In addition to the calf pain, the patient has been experiencing daily headaches for the past few months. The headaches are described as a band-like sensation across the head, sometimes more pronounced on the left side. Accompanying symptoms include nausea, lightheadedness, and a sensation of impending fainting. The patient also reports experiencing heat flashes. The headaches are somewhat alleviated by sleep and the application of an ice pack to the chest. Over-the-counter Tylenol provides minimal relief.  The patient also reports a sensation of tension at the crown of the head, particularly upon waking and getting out of bed. This tension is relieved by applying pressure to the  area. The patient denies any changes in vision or other neurological symptoms. The patient has a history of using Nexplanon, a contraceptive implant, and has concerns about potential neurological side effects.  The patient's symptoms have not improved with Toradol injections. The patient has been taking Tylenol daily for the headaches, which provides minimal relief. The patient has not previously tried other medications for the headaches. The patient denies any changes in symptoms with the use of medications.         Past Medical History:  Diagnosis Date   Abnormal thyroid function test    Allergy    pollen   Anemia     Medications: Outpatient Medications Prior to Visit  Medication Sig   Multiple Vitamin (MULTIVITAMIN PO) Take by mouth.   PREBIOTIC PRODUCT PO Take by mouth.   Probiotic Product (PROBIOTIC DAILY PO) Take by mouth.   [DISCONTINUED] Biotin w/ Vitamins C & E (HAIR/SKIN/NAILS PO) Take by mouth.   No facility-administered medications prior to visit.    Review of Systems  Last CBC Lab Results  Component Value Date   WBC 7.1 10/16/2023   HGB 14.2 10/16/2023   HCT 43.5 10/16/2023   MCV 86 10/16/2023   MCH 28.0 10/16/2023   RDW 13.1 10/16/2023   PLT 355 10/16/2023   Last metabolic panel Lab Results  Component Value Date   GLUCOSE 80 10/16/2023   NA 140 10/16/2023   K 4.2 10/16/2023  CL 103 10/16/2023   CO2 25 10/16/2023   BUN 12 10/16/2023   CREATININE 0.81 10/16/2023   EGFR 99 10/16/2023   CALCIUM 9.7 10/16/2023   PROT 7.4 10/16/2023   ALBUMIN 4.1 10/16/2023   LABGLOB 3.3 10/16/2023   BILITOT 0.2 10/16/2023   ALKPHOS 70 10/16/2023   AST 21 10/16/2023   ALT 19 10/16/2023   Last hemoglobin A1c Lab Results  Component Value Date   HGBA1C 5.8 (H) 10/16/2023   Last thyroid functions Lab Results  Component Value Date   TSH 0.722 10/16/2023   T3TOTAL 135 06/23/2019   T4TOTAL 15.1 (H) 05/21/2019    VAS [place] LOWER EXTREMITY VENOUS (DVT)  Result  Date: 10/29/2023  Lower Venous DVT Study Patient Name:  Margaret Harmon  Date of Exam:   10/28/2023 Medical Rec #: [id number]        Accession #:    [id number] Date of Birth: 1991-05-30        Patient Gender: F Patient Age:   59 years Exam Location:  [facility] Procedure:      VAS [place] LOWER EXTREMITY VENOUS (DVT) Referring Phys: Tawanna Cooler SIMMONS-ROBINSON --------------------------------------------------------------------------------  Indications: Pain.  Risk Factors: None identified. Limitations: Poor ultrasound/tissue interface and body habitus. Comparison Study: No prior studies. Performing Technologist: Chanda Busing RVT  Examination Guidelines: A complete evaluation includes B-mode imaging, spectral Doppler, color Doppler, and power Doppler as needed of all accessible portions of each vessel. Bilateral testing is considered an integral part of a complete examination. Limited examinations for reoccurring indications may be performed as noted. The reflux portion of the exam is performed with the patient in reverse Trendelenburg.  +---------+---------------+---------+-----------+----------+--------------+ RIGHT    CompressibilityPhasicitySpontaneityPropertiesThrombus Aging +---------+---------------+---------+-----------+----------+--------------+ CFV      Full           Yes      Yes                                 +---------+---------------+---------+-----------+----------+--------------+ SFJ      Full                                                        +---------+---------------+---------+-----------+----------+--------------+ FV Prox  Full                                                        +---------+---------------+---------+-----------+----------+--------------+ FV Mid                  Yes      Yes                                 +---------+---------------+---------+-----------+----------+--------------+ FV Distal               Yes      Yes                                  +---------+---------------+---------+-----------+----------+--------------+ PFV      Full                                                        +---------+---------------+---------+-----------+----------+--------------+  POP      Full           Yes      Yes                                 +---------+---------------+---------+-----------+----------+--------------+ PTV      Full                                                        +---------+---------------+---------+-----------+----------+--------------+ PERO     Full                                                        +---------+---------------+---------+-----------+----------+--------------+   +----+---------------+---------+-----------+----------+--------------+ LEFTCompressibilityPhasicitySpontaneityPropertiesThrombus Aging +----+---------------+---------+-----------+----------+--------------+ CFV Full           Yes      Yes                                 +----+---------------+---------+-----------+----------+--------------+    Summary: RIGHT: - There is no evidence of deep vein thrombosis in the lower extremity. However, portions of this examination were limited- see technologist comments above.  - No cystic structure found in the popliteal fossa.  LEFT: - No evidence of common femoral vein obstruction.   *See table(s) above for measurements and observations. Electronically signed by Heath Lark on 10/29/2023 at 4:44:52 PM.    Final    MR LIVER W WO CONTRAST  Result Date: 10/22/2023 CLINICAL DATA:  Liver lesion EXAM: MRI ABDOMEN WITHOUT AND WITH CONTRAST TECHNIQUE: Multiplanar multisequence MR imaging of the abdomen was performed both before and after the administration of intravenous contrast. CONTRAST:  10mL GADAVIST GADOBUTROL 1 MMOL/ML IV SOLN COMPARISON:  None Available. FINDINGS: Lower chest: No acute abnormality. Hepatobiliary: No solid liver abnormality is seen. Definitively benign  hemangioma of the posterior right lobe of the liver, hepatic segment VII/VIII, measuring 4.5 x 4.1 cm (series 19, image 40). Additional smaller definitively benign hemangioma of the inferior left lobe of the liver, hepatic segment IV B, adjacent to the falciform ligament, measuring 2.0 x 1.7 cm (series 19, image 62). Small, definitively benign flash filling hemangioma of the inferior left lobe of the liver, hepatic segment III, measuring 0.9 cm (series 19, image 55). No gallstones, gallbladder wall thickening, or biliary dilatation. Pancreas: Unremarkable. No pancreatic ductal dilatation or surrounding inflammatory changes. Spleen: Normal in size without significant abnormality. Adrenals/Urinary Tract: Adrenal glands are unremarkable. Kidneys are normal, without renal calculi, solid lesion, or hydronephrosis. Stomach/Bowel: Stomach is within normal limits. No evidence of bowel wall thickening, distention, or inflammatory changes. Vascular/Lymphatic: No significant vascular findings are present. No enlarged abdominal lymph nodes. Other: No abdominal wall hernia or abnormality. No ascites. Musculoskeletal: No acute or significant osseous findings. IMPRESSION: 1. Definitively benign hepatic hemangiomata, the largest of which measures 4.5 x 4.1 cm in the posterior right lobe. No suspicious liver lesion. 2.  No acute findings in the abdomen. Electronically Signed   By: Jearld Lesch M.D.   On: 10/22/2023 17:01         Objective  BP 134/83   Pulse 83   Resp 16   Ht 5\' 5"  (1.651 m)   Wt (!) 327 lb (148.3 kg)   SpO2 98%   BMI 54.42 kg/m  BP Readings from Last 3 Encounters:  11/06/23 134/83  10/16/23 122/79  08/20/23 131/70   Wt Readings from Last 3 Encounters:  11/06/23 (!) 327 lb (148.3 kg)  10/16/23 (!) 331 lb (150.1 kg)  08/20/23 (!) 328 lb (148.8 kg)        Physical Exam  Physical Exam   NEUROLOGICAL: Pupils equal, round, and reactive to light and accommodation bilaterally. Strength 5/5  in bilateral upper and lower extremities. Cranial nerves II through XII grossly intact bilaterally. Normal extraocular movements bilaterally. Able to squeeze hand, push and pull against resistance, and shrug shoulders without difficulty.       No results found for any visits on 11/06/23.  Assessment & Plan     Problem List Items Addressed This Visit       Cardiovascular and Mediastinum   Postural dizziness with presyncope   Relevant Orders   MR Brain Wo Contrast     Other   Right calf pain    Persistent right calf pain, exacerbated by prolonged standing, relieved by rest. Previous DVT ultrasound was negative. Pain described as localized to the calf, not radiating. Tylenol provides minimal relief. Considering musculoskeletal etiology such as microtears or strain. Discussed starting with x-ray and if normal, considering orthopedic referral for further evaluation. - Order x-ray of the right lower leg - Recommend use of Voltaren gel topically - Consider referral to orthopedics if x-ray is normal      Relevant Orders   DG Tibia/Fibula Right   NDPH (new daily persistent headache) - Primary    Reports daily headaches for the past few months, described as a band-like sensation across the head, sometimes more on the left side, associated with nausea, lightheadedness, and heat flashes. Tylenol provides minimal relief. Previous Toradol injection was ineffective. No visual disturbances reported except for seeing stars when bending down. Previous CT head in 2021 was normal. Considering migraine treatment with Imitrex to assess response. Discussed that Imitrex should not be taken daily and if ineffective, alternative treatments will be considered. - Prescribe Imitrex 25 mg, take one dose and may repeat in 2 hours if needed, not to exceed two doses in 48 hours - Order MRI brain without contrast - Follow up in 4 weeks to assess headache response and review MRI results - if normal, recommend neuro  referral in [place]      Relevant Medications   SUMAtriptan (IMITREX) 25 MG tablet   Other Relevant Orders   MR Brain Wo Contrast         Benign Liver Hemangioma MRI of the liver showed a benign hemangioma with no recommendation for further follow-up. - No further follow-up required for liver hemangioma.       Return in about 3 weeks (around 11/27/2023) for HA, right leg pain.       Ronnald Ramp, MD  [facility] [phone] (phone) [phone] (fax)  [facility]

## 2023-11-07 NOTE — Assessment & Plan Note (Signed)
Reports daily headaches for the past few months, described as a band-like sensation across the head, sometimes more on the left side, associated with nausea, lightheadedness, and heat flashes. Tylenol provides minimal relief. Previous Toradol injection was ineffective. No visual disturbances reported except for seeing stars when bending down. Previous CT head in 2021 was normal. Considering migraine treatment with Imitrex to assess response. Discussed that Imitrex should not be taken daily and if ineffective, alternative treatments will be considered. - Prescribe Imitrex 25 mg, take one dose and may repeat in 2 hours if needed, not to exceed two doses in 48 hours - Order MRI brain without contrast - Follow up in 4 weeks to assess headache response and review MRI results - if normal, recommend neuro referral in [place]

## 2023-11-07 NOTE — Assessment & Plan Note (Signed)
Persistent right calf pain, exacerbated by prolonged standing, relieved by rest. Previous DVT ultrasound was negative. Pain described as localized to the calf, not radiating. Tylenol provides minimal relief. Considering musculoskeletal etiology such as microtears or strain. Discussed starting with x-ray and if normal, considering orthopedic referral for further evaluation. - Order x-ray of the right lower leg - Recommend use of Voltaren gel topically - Consider referral to orthopedics if x-ray is normal

## 2023-11-13 ENCOUNTER — Encounter (HOSPITAL_COMMUNITY): Payer: Self-pay

## 2023-11-13 ENCOUNTER — Ambulatory Visit (HOSPITAL_COMMUNITY)
Admission: RE | Admit: 2023-11-13 | Discharge: 2023-11-13 | Disposition: A | Discharge status: Home / Self Care | Payer: Commercial Managed Care - PPO | Source: Ambulatory Visit | Attending: Family Medicine | Admitting: Family Medicine

## 2023-11-13 DIAGNOSIS — R42 Dizziness and giddiness: Secondary | ICD-10-CM | POA: Insufficient documentation

## 2023-11-13 DIAGNOSIS — R55 Syncope and collapse: Secondary | ICD-10-CM | POA: Insufficient documentation

## 2023-11-13 DIAGNOSIS — G4452 New daily persistent headache (NDPH): Secondary | ICD-10-CM | POA: Insufficient documentation

## 2023-11-17 ENCOUNTER — Ambulatory Visit (HOSPITAL_COMMUNITY)
Admission: RE | Admit: 2023-11-17 | Discharge: 2023-11-17 | Disposition: A | Discharge status: Home / Self Care | Payer: Commercial Managed Care - PPO | Source: Ambulatory Visit | Attending: Family Medicine

## 2023-11-17 DIAGNOSIS — G4452 New daily persistent headache (NDPH): Secondary | ICD-10-CM | POA: Diagnosis not present

## 2023-11-17 DIAGNOSIS — R55 Syncope and collapse: Secondary | ICD-10-CM | POA: Diagnosis not present

## 2023-11-17 DIAGNOSIS — R42 Dizziness and giddiness: Secondary | ICD-10-CM | POA: Diagnosis not present

## 2023-12-11 ENCOUNTER — Ambulatory Visit: Payer: Commercial Managed Care - PPO | Admitting: Family Medicine

## 2023-12-23 NOTE — Progress Notes (Unsigned)
      Established patient visit   Patient: Margaret Harmon   DOB: 21-Dec-1990   32 y.o. Female  MRN: [id number] Visit Date: 12/24/2023  Today's healthcare provider: Ronnald Ramp, MD   No chief complaint on file.  Subjective      Appt was cancelled on provider end for patient to get imaging to determine next steps for treatment of right LE calf pain. Pt was present for appt.      Ronnald Ramp, MD  [facility] [phone] (phone) [phone] (fax)  [facility]

## 2023-12-24 ENCOUNTER — Ambulatory Visit (INDEPENDENT_AMBULATORY_CARE_PROVIDER_SITE_OTHER): Payer: Commercial Managed Care - PPO | Admitting: Family Medicine

## 2023-12-24 ENCOUNTER — Ambulatory Visit
Admission: RE | Admit: 2023-12-24 | Discharge: 2023-12-24 | Disposition: A | Discharge status: Home / Self Care | Payer: Commercial Managed Care - PPO | Attending: Family Medicine | Admitting: Family Medicine

## 2023-12-24 ENCOUNTER — Ambulatory Visit
Admission: RE | Admit: 2023-12-24 | Discharge: 2023-12-24 | Disposition: A | Discharge status: Home / Self Care | Payer: Commercial Managed Care - PPO | Source: Ambulatory Visit | Attending: Family Medicine | Admitting: Family Medicine

## 2023-12-24 ENCOUNTER — Encounter: Payer: Self-pay | Admitting: Family Medicine

## 2023-12-24 VITALS — BP 128/66 | HR 80

## 2023-12-24 DIAGNOSIS — M79661 Pain in right lower leg: Secondary | ICD-10-CM | POA: Diagnosis not present

## 2023-12-25 ENCOUNTER — Encounter: Payer: Self-pay | Admitting: Family Medicine

## 2024-01-15 ENCOUNTER — Ambulatory Visit: Payer: Self-pay | Admitting: Family Medicine

## 2024-02-03 ENCOUNTER — Encounter: Payer: Self-pay | Admitting: Family Medicine

## 2024-02-03 ENCOUNTER — Ambulatory Visit: Payer: Commercial Managed Care - PPO | Admitting: Family Medicine

## 2024-02-03 VITALS — BP 137/90 | HR 76 | Ht 65.0 in | Wt 335.0 lb

## 2024-02-03 DIAGNOSIS — M79662 Pain in left lower leg: Secondary | ICD-10-CM

## 2024-02-03 DIAGNOSIS — G4452 New daily persistent headache (NDPH): Secondary | ICD-10-CM

## 2024-02-03 MED ORDER — MELOXICAM 7.5 MG PO TABS
7.5000 mg | ORAL_TABLET | Freq: Every day | ORAL | 0 refills | Status: AC
Start: 2024-02-03 — End: ?

## 2024-02-03 NOTE — Assessment & Plan Note (Signed)
 Intermittent sharp, stabbing headaches every other day, lasting 30 minutes, localized to the front of the head. Symptoms have improved but persist. No abnormalities noted on brain MRI. Discussed referral to neurology. Patient prefers referral to a neurology practice in [place]. - Refer to neurology for further evaluation - Patient to await call for neurology scheduling

## 2024-02-03 NOTE — Progress Notes (Signed)
 Established patient visit   Patient: Margaret Harmon   DOB: 07-11-1991   32 y.o. Female  MRN: [id number] Visit Date: 02/03/2024  Today's healthcare provider: Ronnald Ramp, MD   Chief Complaint  Patient presents with   Leg Pain    No change since the last visit, still having the R calf pain    Subjective     HPI     Leg Pain    Additional comments: No change since the last visit, still having the R calf pain       Last edited by Ronnald Ramp, MD on 02/03/2024  9:13 AM.       Discussed the use of AI scribe software for clinical note transcription with the patient, who gave verbal consent to proceed.  History of Present Illness   Margaret Harmon is a 33 year old female who presents with persistent right calf pain. She was recommended to be referred to orthopedics for continued evaluation.  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 The pain is intermittent, primarily located in the upper calf, and sometimes radiates to the knee. It does not prevent her from walking but is bothersome, especially when standing for long periods. She has tried compression hose once without relief and has not been taking any medication regularly for this issue. A prior workup included a negative DVT study and normal x-ray findings of the bone, knee, and joint. No weakness or feeling that her leg might give out, and no pain in her thigh. The pain is mostly in the calf and sometimes extends to the knee.  She reports experiencing headaches every other day, described as sharp stabbing pain located at the front of her head, lasting about 30 minutes. These headaches are not influenced by diet, sleep, or hydration, and she notes a possible correlation with weather changes.  Her blood pressure was noted to be elevated at 137/90 during the visit, although she usually checks it at  home and finds it to be normal.         Past Medical History:  Diagnosis Date   Abnormal thyroid function test    Allergy    pollen   Anemia     Medications: Outpatient Medications Prior to Visit  Medication Sig   Multiple Vitamin (MULTIVITAMIN PO) Take by mouth.   PREBIOTIC PRODUCT PO Take by mouth.   SUMAtriptan (IMITREX) 25 MG tablet Take 1 tablet (25 mg total) by mouth every 2 (two) hours as needed for migraine. May repeat in 2 hours if headache persists or recurs.   Probiotic Product (PROBIOTIC DAILY PO) Take by mouth. (Patient not taking: Reported on 02/03/2024)   No facility-administered medications prior to visit.    Review of Systems  Last CBC Lab Results  Component Value Date   WBC 7.1 10/16/2023   HGB 14.2 10/16/2023   HCT 43.5 10/16/2023   MCV 86 10/16/2023   MCH 28.0 10/16/2023   RDW 13.1 10/16/2023   PLT 355 10/16/2023   Last metabolic panel Lab Results  Component Value Date   GLUCOSE 80 10/16/2023   NA 140 10/16/2023   K 4.2 10/16/2023   CL 103 10/16/2023   CO2 25 10/16/2023   BUN 12 10/16/2023   CREATININE 0.81 10/16/2023   EGFR 99 10/16/2023   CALCIUM 9.7 10/16/2023   PROT 7.4 10/16/2023   ALBUMIN 4.1 10/16/2023  LABGLOB 3.3 10/16/2023   BILITOT 0.2 10/16/2023   ALKPHOS 70 10/16/2023   AST 21 10/16/2023   ALT 19 10/16/2023   Last lipids Lab Results  Component Value Date   CHOL 183 10/23/2020   HDL 45.80 10/23/2020   LDLCALC 110 (H) 10/23/2020   TRIG 139.0 10/23/2020   CHOLHDL 4 10/23/2020   Last hemoglobin A1c Lab Results  Component Value Date   HGBA1C 5.8 (H) 10/16/2023   Last thyroid functions Lab Results  Component Value Date   TSH 0.722 10/16/2023   T3TOTAL 135 06/23/2019   T4TOTAL 15.1 (H) 05/21/2019        Objective    BP (!) 137/90   Pulse 76   Ht 5\' 5"  (1.651 m)   Wt (!) 335 lb (152 kg)   SpO2 100%   BMI 55.75 kg/m  BP Readings from Last 3 Encounters:  02/03/24 (!) 137/90  12/24/23 128/66  11/06/23  134/83   Wt Readings from Last 3 Encounters:  02/03/24 (!) 335 lb (152 kg)  11/06/23 (!) 327 lb (148.3 kg)  10/16/23 (!) 331 lb (150.1 kg)        Physical Exam  Physical Exam   VITALS: BP- 137/90 General: Alert, no acute distress Cardio: Normal S1 and S2, RRR, no r/m/g Pulm: CTAB, normal work of breathing  EXTREMITIES: Tenderness in the upper right calf, predominantly in the gastrocnemius muscle. No erythema of overlying skin, normal gait, no edema of right calf, normal ROM of right LE       No results found for any visits on 02/03/24.  Assessment & Plan     Problem List Items Addressed This Visit       Other   Right calf pain   Persistent right calf pain, described as muscle spasm. Negative DVT study and normal x-ray. Pain is intermittent, exacerbated by standing. Differential includes tendonitis or muscle inflammation. Discussed meloxicam 7.5 mg daily for one month, with option to discontinue if symptoms improve. Advised against concurrent ibuprofen use. If pain persists, referral to orthopedics for further evaluation, including potential ultrasound for micro tears or inflammation. Patient prefers to try meloxicam first. - Prescribe meloxicam 7.5 mg daily for one month - Advise against concurrent ibuprofen use - Refer to orthopedics if pain persists after one month - Recommend compression hose if tolerated      Relevant Medications   meloxicam (MOBIC) 7.5 MG tablet   NDPH (new daily persistent headache) - Primary   Intermittent sharp, stabbing headaches every other day, lasting 30 minutes, localized to the front of the head. Symptoms have improved but persist. No abnormalities noted on brain MRI. Discussed referral to neurology. Patient prefers referral to a neurology practice in [place]. - Refer to neurology for further evaluation - Patient to await call for neurology scheduling      Relevant Medications   meloxicam (MOBIC) 7.5 MG tablet   Other Relevant Orders    Ambulatory referral to Neurology        Elevated Blood Pressure Blood pressure reading of 137/90 mmHg today. Patient reports normal readings at home. Recheck advised. - Recheck blood pressure before patient leaves -BP remains elevated >140/90, will have patient monitor BP and follow up with me in 1 month   Follow-up - Patient to notify via MyChart if meloxicam is ineffective - Schedule follow-up if symptoms persist or worsen.         No follow-ups on file.         Ronnald Ramp, MD  [facility] [phone] (phone) [phone] (fax)  [facility]

## 2024-02-03 NOTE — Assessment & Plan Note (Signed)
 Persistent right calf pain, described as muscle spasm. Negative DVT study and normal x-ray. Pain is intermittent, exacerbated by standing. Differential includes tendonitis or muscle inflammation. Discussed meloxicam 7.5 mg daily for one month, with option to discontinue if symptoms improve. Advised against concurrent ibuprofen use. If pain persists, referral to orthopedics for further evaluation, including potential ultrasound for micro tears or inflammation. Patient prefers to try meloxicam first. - Prescribe meloxicam 7.5 mg daily for one month - Advise against concurrent ibuprofen use - Refer to orthopedics if pain persists after one month - Recommend compression hose if tolerated

## 2024-02-06 ENCOUNTER — Other Ambulatory Visit: Payer: Self-pay | Admitting: Family Medicine

## 2024-02-06 MED ORDER — HYDROCHLOROTHIAZIDE 25 MG PO TABS: 12.5000 mg | ORAL_TABLET | Freq: Every day | ORAL | 1 refills | Status: DC

## 2024-02-09 NOTE — Telephone Encounter (Signed)
 Requested medication (s) are due for refill today: No  Requested medication (s) are on the active medication list: Yes  Last refill:  02/06/24  Future visit scheduled:   Notes to clinic:  See pharmacy request.    Requested Prescriptions  Pending Prescriptions Disp Refills   hydrochlorothiazide (HYDRODIURIL) 25 MG tablet [Pharmacy Med Name: HYDROCHLOROTHIAZIDE 25 MG TAB] 90 tablet 1    Sig: Take 0.5 tablets (12.5 mg total) by mouth daily. Take one tablet by mouth once daily     Cardiovascular: Diuretics - Thiazide Failed - 02/09/2024 12:04 PM      Failed - Last BP in normal range    BP Readings from Last 1 Encounters:  02/03/24 (!) 137/90         Passed - Cr in normal range and within 180 days    Creatinine, Ser  Date Value Ref Range Status  10/16/2023 0.81 0.57 - 1.00 mg/dL Final         Passed - K in normal range and within 180 days    Potassium  Date Value Ref Range Status  10/16/2023 4.2 3.5 - 5.2 mmol/L Final         Passed - Na in normal range and within 180 days    Sodium  Date Value Ref Range Status  10/16/2023 140 134 - 144 mmol/L Final         Passed - Valid encounter within last 6 months    Recent Outpatient Visits           1 month ago Right calf pain   [place] [facility] Simmons-Robinson, [place], MD   3 months ago NDPH (new daily persistent headache)   [place] [facility] Simmons-Robinson, [place], MD   3 months ago Postural dizziness with presyncope   [place] [facility] Simmons-Robinson, Tawanna Cooler, MD       Future Appointments             In 3 weeks Simmons-Robinson, Tawanna Cooler, MD [facility]

## 2024-02-10 NOTE — Telephone Encounter (Signed)
 Please advise there are 2 set of directions.

## 2024-02-11 ENCOUNTER — Other Ambulatory Visit: Payer: Self-pay | Admitting: Family Medicine

## 2024-02-11 MED ORDER — HYDROCHLOROTHIAZIDE 25 MG PO TABS: 12.5000 mg | ORAL_TABLET | Freq: Every day | ORAL | 1 refills | Status: AC

## 2024-03-05 ENCOUNTER — Ambulatory Visit: Admitting: Family Medicine

## 2024-03-19 ENCOUNTER — Ambulatory Visit: Payer: Self-pay | Admitting: Family Medicine

## 2024-05-06 ENCOUNTER — Ambulatory Visit: Payer: Self-pay | Admitting: Family Medicine

## 2024-05-06 NOTE — Progress Notes (Deleted)
      Established patient visit   Patient: Margaret Harmon   DOB: 1990/12/06   33 y.o. Female  MRN: [id number] Visit Date: 05/06/2024  Today's healthcare provider: Mimi Alt, MD   No chief complaint on file.  Subjective       Discussed the use of AI scribe software for clinical note transcription with the patient, who gave verbal consent to proceed.  History of Present Illness      Past Medical History:  Diagnosis Date   Abnormal thyroid  function test    Allergy    pollen   Anemia     Medications: Outpatient Medications Prior to Visit  Medication Sig   hydrochlorothiazide  (HYDRODIURIL ) 25 MG tablet Take 0.5 tablets (12.5 mg total) by mouth daily.   meloxicam  (MOBIC ) 7.5 MG tablet Take 1 tablet (7.5 mg total) by mouth daily.   Multiple Vitamin (MULTIVITAMIN PO) Take by mouth.   PREBIOTIC PRODUCT PO Take by mouth.   Probiotic Product (PROBIOTIC DAILY PO) Take by mouth. (Patient not taking: Reported on 02/03/2024)   SUMAtriptan  (IMITREX ) 25 MG tablet Take 1 tablet (25 mg total) by mouth every 2 (two) hours as needed for migraine. May repeat in 2 hours if headache persists or recurs.   No facility-administered medications prior to visit.    Review of Systems  {Insert previous labs (optional):23779} {See past labs  Heme  Chem  Endocrine  Serology  Results Review (optional):1}   Objective    There were no vitals taken for this visit. {Insert last BP/Wt (optional):23777}{See vitals history (optional):1}    Physical Exam  ***  No results found for any visits on 05/06/24.  Assessment & Plan     Problem List Items Addressed This Visit   None   Assessment and Plan Assessment & Plan      No follow-ups on file.         Mimi Alt, MD  [facility] [phone] (phone) [phone] (fax)  [facility]
